# Patient Record
Sex: Male | Born: 1980
Health system: Southern US, Community
[De-identification: ages and names within clinical notes are randomized; demographics above are authoritative.]

## PROBLEM LIST (undated history)

## (undated) DIAGNOSIS — K219 Gastro-esophageal reflux disease without esophagitis: Secondary | ICD-10-CM

## (undated) DIAGNOSIS — H409 Unspecified glaucoma: Secondary | ICD-10-CM

## (undated) DIAGNOSIS — K760 Fatty (change of) liver, not elsewhere classified: Secondary | ICD-10-CM

## (undated) DIAGNOSIS — E7800 Pure hypercholesterolemia, unspecified: Secondary | ICD-10-CM

## (undated) DIAGNOSIS — Z87442 Personal history of urinary calculi: Secondary | ICD-10-CM

## (undated) HISTORY — DX: Gastro-esophageal reflux disease without esophagitis: K21.9

## (undated) HISTORY — DX: Personal history of urinary calculi: Z87.442

## (undated) HISTORY — PX: DENTAL RESTORATION/EXTRACTION WITH X-RAY: SHX5796

## (undated) HISTORY — PX: CARDIOVASCULAR STRESS TEST: SHX262

## (undated) HISTORY — DX: Pure hypercholesterolemia, unspecified: E78.00

## (undated) HISTORY — DX: Unspecified glaucoma: H40.9

## (undated) HISTORY — DX: Fatty (change of) liver, not elsewhere classified: K76.0

---

## 2011-03-03 ENCOUNTER — Ambulatory Visit: Payer: BC Managed Care – HMO | Attending: Orthopedic Surgery | Admitting: Physical Therapy

## 2011-03-03 DIAGNOSIS — M545 Low back pain, unspecified: Secondary | ICD-10-CM | POA: Insufficient documentation

## 2011-03-03 DIAGNOSIS — IMO0001 Reserved for inherently not codable concepts without codable children: Secondary | ICD-10-CM | POA: Insufficient documentation

## 2011-03-03 DIAGNOSIS — R293 Abnormal posture: Secondary | ICD-10-CM | POA: Insufficient documentation

## 2011-03-03 DIAGNOSIS — R5381 Other malaise: Secondary | ICD-10-CM | POA: Insufficient documentation

## 2011-03-05 ENCOUNTER — Ambulatory Visit: Payer: BC Managed Care – HMO | Admitting: Physical Therapy

## 2011-03-10 ENCOUNTER — Ambulatory Visit: Payer: BC Managed Care – HMO | Admitting: Physical Therapy

## 2011-03-12 ENCOUNTER — Encounter: Payer: BC Managed Care – HMO | Admitting: Physical Therapy

## 2011-03-13 ENCOUNTER — Ambulatory Visit: Payer: BC Managed Care – HMO | Admitting: Physical Therapy

## 2011-03-17 ENCOUNTER — Ambulatory Visit: Payer: BC Managed Care – HMO | Admitting: *Deleted

## 2011-03-18 ENCOUNTER — Ambulatory Visit: Payer: BC Managed Care – HMO | Admitting: Physical Therapy

## 2011-03-19 ENCOUNTER — Ambulatory Visit: Payer: BC Managed Care – HMO | Admitting: Physical Therapy

## 2011-03-20 ENCOUNTER — Ambulatory Visit: Payer: BC Managed Care – HMO | Admitting: Physical Therapy

## 2011-03-24 ENCOUNTER — Ambulatory Visit: Payer: BC Managed Care – HMO | Attending: Orthopedic Surgery | Admitting: Physical Therapy

## 2011-03-24 DIAGNOSIS — M545 Low back pain, unspecified: Secondary | ICD-10-CM | POA: Insufficient documentation

## 2011-03-24 DIAGNOSIS — R293 Abnormal posture: Secondary | ICD-10-CM | POA: Insufficient documentation

## 2011-03-24 DIAGNOSIS — R5381 Other malaise: Secondary | ICD-10-CM | POA: Insufficient documentation

## 2011-03-24 DIAGNOSIS — IMO0001 Reserved for inherently not codable concepts without codable children: Secondary | ICD-10-CM | POA: Insufficient documentation

## 2011-03-26 ENCOUNTER — Ambulatory Visit: Payer: BC Managed Care – HMO | Admitting: Physical Therapy

## 2011-03-31 ENCOUNTER — Encounter: Payer: BC Managed Care – HMO | Admitting: Physical Therapy

## 2011-04-01 ENCOUNTER — Ambulatory Visit: Payer: BC Managed Care – HMO | Admitting: Physical Therapy

## 2011-04-02 ENCOUNTER — Ambulatory Visit: Payer: BC Managed Care – HMO | Admitting: Physical Therapy

## 2011-04-09 ENCOUNTER — Ambulatory Visit: Payer: BC Managed Care – HMO | Admitting: Physical Therapy

## 2016-08-18 DIAGNOSIS — H401131 Primary open-angle glaucoma, bilateral, mild stage: Secondary | ICD-10-CM | POA: Diagnosis not present

## 2016-11-23 HISTORY — PX: ESOPHAGOGASTRODUODENOSCOPY: SHX1529

## 2016-12-31 DIAGNOSIS — S0502XA Injury of conjunctiva and corneal abrasion without foreign body, left eye, initial encounter: Secondary | ICD-10-CM | POA: Diagnosis not present

## 2016-12-31 DIAGNOSIS — H40053 Ocular hypertension, bilateral: Secondary | ICD-10-CM | POA: Diagnosis not present

## 2017-01-15 DIAGNOSIS — H401131 Primary open-angle glaucoma, bilateral, mild stage: Secondary | ICD-10-CM | POA: Diagnosis not present

## 2017-02-16 ENCOUNTER — Ambulatory Visit (INDEPENDENT_AMBULATORY_CARE_PROVIDER_SITE_OTHER): Payer: BLUE CROSS/BLUE SHIELD | Admitting: Family Medicine

## 2017-02-16 ENCOUNTER — Encounter (INDEPENDENT_AMBULATORY_CARE_PROVIDER_SITE_OTHER): Payer: Self-pay

## 2017-02-16 ENCOUNTER — Encounter: Payer: Self-pay | Admitting: Family Medicine

## 2017-02-16 VITALS — BP 136/86 | HR 74 | Temp 97.1°F | Ht 71.0 in | Wt 214.4 lb

## 2017-02-16 DIAGNOSIS — K5900 Constipation, unspecified: Secondary | ICD-10-CM

## 2017-02-16 DIAGNOSIS — R079 Chest pain, unspecified: Secondary | ICD-10-CM

## 2017-02-16 NOTE — Progress Notes (Signed)
   HPI  Patient presents today here to establish care and discuss chest pain and constipation.  Chest pain Patient describes left-sided "cold feeling" on his left chest that radiates to his left arm. He states that he is concerned that he is going to "have a heart attack. His symptoms are not exertional. They come on at rest or with activity. They often last about 30 minutes. They're not associated with shortness of breath, sweating, or racing heart.  Constipation Has been going on for several years, patient states that he had improvement with Metamucil states that this is not easy to maintain for him. He has tried intermittent probiotics.  Patient exercises regularly, however has not exercised for about 2 months. He watches his diet carefully avoiding fried and fatty foods. He has stopped drinking sodas and sugar sweetened beverages.  His father has not had any heart attacks, his father's cousin had a heart attack at a young age in his 1s.  Patient has been told that his cholesterol was very high at a cholesterol screening  PMH: Diverticulitis, duodenitis Family history: COPD father, diabetes maternal grandmother, pancreatic cancer mother, TIA and father Social history: Former smoker, does not drink alcohol, works as a Development worker, community Past surgical history- none ROS: Per HPI  Objective: BP 136/86   Pulse 74   Temp 97.1 F (36.2 C) (Oral)   Ht _0  (1.803 m)   Wt 214 lb 6.4 oz (97.3 kg)   BMI 29.90 kg/m  Gen: NAD, alert, cooperative with exam HEENT: NCAT, right TM obscured by cerumen, left TM within normal limits, oropharynx clear, nares clear, PERRLA, EOMI CV: RRR, good S1/S2, no murmur Resp: CTABL, no wheezes, non-labored Abd: SNTND, BS present, no guarding or organomegaly Ext: No edema, warm Neuro: Alert and oriented, 1+ symmetric patellar tendon reflexes, strength 5/5 and sensation intact in bilateral lower extremities  Assessment and plan:  # Chest  pain Atypical chest pain, no exertional symptoms EKG WNL Reassurance provided, most likely anxiety related, would also consider neurologic origin with unsual cold sensation.  liberate exercise  # Constipation Asymptomatic Recommend a daily probiotic or yogurt +25 g of fiber daily, increase fluid intake     Orders Placed This Encounter  Procedures  . CMP14+EGFR    Standing Status:   Future    Standing Expiration Date:   02/16/2018  . CBC with Differential/Platelet    Standing Status:   Future    Standing Expiration Date:   02/16/2018  . Lipid panel    Standing Status:   Future    Standing Expiration Date:   02/16/2018  . TSH    Standing Status:   Future    Standing Expiration Date:   02/16/2018  . EKG 12-Lead    Meds ordered this encounter  Medications  . latanoprost (XALATAN) 0.005 % ophthalmic solution    Sig: 1 drop at bedtime.    Laroy Apple, MD [facility] 02/16/2017, 1:49 PM

## 2017-02-16 NOTE — Patient Instructions (Addendum)
Great to meet you!  Please come back for labs when you are fasting.    Lets plan on seeing you again in about 3 months to discuss the cold feeling in your chest.

## 2017-02-27 ENCOUNTER — Other Ambulatory Visit: Payer: BLUE CROSS/BLUE SHIELD

## 2017-02-27 ENCOUNTER — Telehealth: Payer: Self-pay | Admitting: Family Medicine

## 2017-02-27 DIAGNOSIS — R079 Chest pain, unspecified: Secondary | ICD-10-CM | POA: Diagnosis not present

## 2017-02-28 LAB — LIPID PANEL
Chol/HDL Ratio: 4.8 ratio (ref 0.0–5.0)
Cholesterol, Total: 207 mg/dL — ABNORMAL HIGH (ref 100–199)
HDL: 43 mg/dL (ref >39)
LDL Calculated: 151 mg/dL — ABNORMAL HIGH (ref 0–99)
Triglycerides: 65 mg/dL (ref 0–149)
VLDL Cholesterol Cal: 13 mg/dL (ref 5–40)

## 2017-02-28 LAB — CBC WITH DIFFERENTIAL/PLATELET
Basophils Absolute: 0 10*3/uL (ref 0.0–0.2)
Basos: 0 %
EOS (ABSOLUTE): 0.2 10*3/uL (ref 0.0–0.4)
Eos: 3 %
Hematocrit: 43.2 % (ref 37.5–51.0)
Hemoglobin: 14.6 g/dL (ref 13.0–17.7)
Immature Grans (Abs): 0 10*3/uL (ref 0.0–0.1)
Immature Granulocytes: 0 %
Lymphocytes Absolute: 2.8 10*3/uL (ref 0.7–3.1)
Lymphs: 40 %
MCH: 29.1 pg (ref 26.6–33.0)
MCHC: 33.8 g/dL (ref 31.5–35.7)
MCV: 86 fL (ref 79–97)
Monocytes Absolute: 0.5 10*3/uL (ref 0.1–0.9)
Monocytes: 7 %
Neutrophils Absolute: 3.4 10*3/uL (ref 1.4–7.0)
Neutrophils: 50 %
Platelets: 253 10*3/uL (ref 150–379)
RBC: 5.01 x10E6/uL (ref 4.14–5.80)
RDW: 13.6 % (ref 12.3–15.4)
WBC: 6.9 10*3/uL (ref 3.4–10.8)

## 2017-02-28 LAB — CMP14+EGFR
ALT: 55 IU/L — ABNORMAL HIGH (ref 0–44)
AST: 28 IU/L (ref 0–40)
Albumin/Globulin Ratio: 1.9 (ref 1.2–2.2)
Albumin: 4.6 g/dL (ref 3.5–5.5)
Alkaline Phosphatase: 47 IU/L (ref 39–117)
BUN/Creatinine Ratio: 18 (ref 9–20)
BUN: 17 mg/dL (ref 6–20)
Bilirubin Total: 0.4 mg/dL (ref 0.0–1.2)
CO2: 23 mmol/L (ref 18–29)
Calcium: 9.4 mg/dL (ref 8.7–10.2)
Chloride: 103 mmol/L (ref 96–106)
Creatinine, Ser: 0.95 mg/dL (ref 0.76–1.27)
GFR calc Af Amer: 120 mL/min/{1.73_m2} (ref >59)
GFR calc non Af Amer: 104 mL/min/{1.73_m2} (ref >59)
Globulin, Total: 2.4 g/dL (ref 1.5–4.5)
Glucose: 90 mg/dL (ref 65–99)
Potassium: 4.5 mmol/L (ref 3.5–5.2)
Sodium: 141 mmol/L (ref 134–144)
Total Protein: 7 g/dL (ref 6.0–8.5)

## 2017-02-28 LAB — TSH: TSH: 2.06 u[IU]/mL (ref 0.450–4.500)

## 2017-03-01 ENCOUNTER — Other Ambulatory Visit: Payer: Self-pay | Admitting: *Deleted

## 2017-03-01 DIAGNOSIS — R748 Abnormal levels of other serum enzymes: Secondary | ICD-10-CM

## 2017-03-04 NOTE — Telephone Encounter (Signed)
Patient states he already spoke to doctor when he came in for his blood work. Patient states he was told to follow up with [place] in 3 months and monitor the rectal bleeding after wiping.

## 2017-03-28 DIAGNOSIS — R1084 Generalized abdominal pain: Secondary | ICD-10-CM | POA: Diagnosis not present

## 2017-03-28 DIAGNOSIS — K5909 Other constipation: Secondary | ICD-10-CM | POA: Diagnosis not present

## 2017-03-30 DIAGNOSIS — K5909 Other constipation: Secondary | ICD-10-CM | POA: Diagnosis not present

## 2017-03-30 DIAGNOSIS — K210 Gastro-esophageal reflux disease with esophagitis: Secondary | ICD-10-CM | POA: Diagnosis not present

## 2017-03-30 DIAGNOSIS — R945 Abnormal results of liver function studies: Secondary | ICD-10-CM | POA: Diagnosis not present

## 2017-04-27 ENCOUNTER — Encounter: Payer: Self-pay | Admitting: Nurse Practitioner

## 2017-04-27 ENCOUNTER — Ambulatory Visit: Payer: BLUE CROSS/BLUE SHIELD | Admitting: Family Medicine

## 2017-04-27 ENCOUNTER — Ambulatory Visit (INDEPENDENT_AMBULATORY_CARE_PROVIDER_SITE_OTHER): Payer: BLUE CROSS/BLUE SHIELD | Admitting: Nurse Practitioner

## 2017-04-27 ENCOUNTER — Ambulatory Visit (INDEPENDENT_AMBULATORY_CARE_PROVIDER_SITE_OTHER): Payer: BLUE CROSS/BLUE SHIELD

## 2017-04-27 VITALS — BP 133/80 | HR 72 | Temp 97.8°F | Ht 71.0 in | Wt 211.0 lb

## 2017-04-27 DIAGNOSIS — R079 Chest pain, unspecified: Secondary | ICD-10-CM

## 2017-04-27 DIAGNOSIS — K5900 Constipation, unspecified: Secondary | ICD-10-CM | POA: Diagnosis not present

## 2017-04-27 DIAGNOSIS — R1084 Generalized abdominal pain: Secondary | ICD-10-CM | POA: Diagnosis not present

## 2017-04-27 NOTE — Progress Notes (Signed)
   Subjective:    Patient ID: Blake Jordan, male    DOB: 08/23/1981, 36 y.o.   MRN: [id number]  HPI Patient comes in today with several complaints: - patient c/o abdominal pain- Has had for a long tome- has trouble with constipation- miralax usually helps- but he does not take everyday. Started back on 2 days ago and has had 4bm today. - chest pain- has been going on for last 6 months- describes pain as numb feeling or achiness. Not heavy feeling or sharp pain. Usually last for 1-2 hours at a time- denies SOB. Cannot relate pain to eating or not eating.just recently started on omeprazole which he is not sure if helps oor not.    Review of Systems  HENT: Negative.   Respiratory: Negative for shortness of breath.   Cardiovascular: Positive for chest pain. Negative for palpitations and leg swelling.  Gastrointestinal: Positive for abdominal pain and constipation. Negative for abdominal distention, diarrhea, nausea and vomiting.  Genitourinary: Negative.   Neurological: Negative.   Psychiatric/Behavioral: Negative.   All other systems reviewed and are negative.      Objective:   Physical Exam  Constitutional: He is oriented to person, place, and time. He appears well-developed and well-nourished. No distress.  Cardiovascular: Normal rate and regular rhythm.   Pulmonary/Chest: Effort normal and breath sounds normal.  Abdominal: Soft. Bowel sounds are normal. There is tenderness (mild diffuse tenderness).  Neurological: He is alert and oriented to person, place, and time.  Skin: Skin is warm.  Psychiatric: He has a normal mood and affect. His behavior is normal. Judgment and thought content normal.   BP 133/80   Pulse 72   Temp 97.8 F (36.6 C) (Oral)   Ht 5\' 11"  (1.803 m)   Wt 211 lb (95.7 kg)   BMI 29.43 kg/m        Assessment & Plan:  1. Generalized abdominal pain - DG Abd 1 View; Future  2. Constipation, unspecified constipation type MIRALAX IN APPLE JUICE  DAILY  3. Chest pain, unspecified type Not coming from heart Could be acid reflux- continue omeprazole daily Decrease spicy and fatty foods Follow up with Dr. Burt Knack, FNP  - EKG 12-Lead

## 2017-04-27 NOTE — Patient Instructions (Signed)
Gastroesophageal Reflux Scan A gastroesophageal reflux scan is a procedure that is used to check for gastroesophageal reflux, which is the backward flow of stomach contents into the tube that carries food from the mouth to the stomach (esophagus). The scan can also show if any stomach contents are inhaled (aspirated) into your lungs. You may need this scan if you have symptoms such as heartburn, vomiting, swallowing problems, or regurgitation. Regurgitation means that swallowed food is returning from the stomach to the esophagus. For this scan, you will drink a liquid that contains a small amount of a radioactive substance (tracer). A scanner with a camera that detects the radioactive tracer is used to see if any of the material backs up into your esophagus. Tell a health care provider about:  Any allergies you have.  All medicines you are taking, including vitamins, herbs, eye drops, creams, and over-the-counter medicines.  Any blood disorders you have.  Any surgeries you have had.  Any medical conditions you have.  If you are pregnant or you think that you may be pregnant.  If you are breastfeeding. What are the risks? Generally, this is a safe procedure. However, problems may occur, including:  Exposure to radiation (a small amount).  Allergic reaction to the radioactive substance. This is rare.  What happens before the procedure?  Ask your health care provider about changing or stopping your regular medicines. This is especially important if you are taking diabetes medicines or blood thinners.  Follow your health care provider's instructions about eating or drinking restrictions. What happens during the procedure?  You will be asked to drink a liquid that contains a small amount of a radioactive tracer. This liquid will probably be similar to orange juice.  You will assume a position lying on your back.  A series of images will be taken of your esophagus and upper  stomach.  You may be asked to move into different positions to help determine if reflux occurs more often when you are in specific positions.  For adults, an abdominal binder with an inflatable cuff may be placed on the belly (abdomen). This may be used to increase abdominal pressure. More images will be taken to see if the increased pressure causes reflux to occur. The procedure may vary among health care providers and hospitals. What happens after the procedure?  Return to your normal activities and your normal diet as directed by your health care provider.  The radioactive tracer will leave your body over the next few days. Drink enough fluid to keep your urine clear or pale yellow. This will help to flush the tracer out of your body.  It is your responsibility to obtain your test results. Ask your health care provider or the department performing the test when and how you will get your results. This information is not intended to replace advice given to you by your health care provider. Make sure you discuss any questions you have with your health care provider. Document Released: 12/31/2005 Document Revised: 08/03/2016 Document Reviewed: 08/21/2014 Elsevier Interactive Patient Education  2018 Elsevier Inc.  

## 2017-04-28 ENCOUNTER — Ambulatory Visit (INDEPENDENT_AMBULATORY_CARE_PROVIDER_SITE_OTHER): Payer: BLUE CROSS/BLUE SHIELD | Admitting: Family Medicine

## 2017-04-28 ENCOUNTER — Encounter: Payer: Self-pay | Admitting: Family Medicine

## 2017-04-28 VITALS — BP 131/76 | HR 73 | Temp 97.5°F | Ht 71.0 in | Wt 209.2 lb

## 2017-04-28 DIAGNOSIS — R1084 Generalized abdominal pain: Secondary | ICD-10-CM | POA: Diagnosis not present

## 2017-04-28 DIAGNOSIS — R0789 Other chest pain: Secondary | ICD-10-CM

## 2017-04-28 DIAGNOSIS — M792 Neuralgia and neuritis, unspecified: Secondary | ICD-10-CM

## 2017-04-28 NOTE — Progress Notes (Signed)
   HPI  Patient presents today here with abdominal pain and left-sided chest pain.  Patient states these had off-and-on abdominal pain he has felt to be associated with constipation. However he states that previously he was diagnosed with gastritis, duodenitis, and diverticulitis with CT scanning. He states also that his brother was diagnosed with stomach cancer and he is 2 years younger than him. Patient is very concerned, he has had improvement since starting mural ask a few days ago.  Chest pain Unusual chest pain described as achiness and cold sensation starting from the left sternum and extending to the left axilla then down the arm. Patient states it last 1-2 hours and goes away. There are no aggravating or alleviating factors, no sharp pains. No exertional pains. No associated shortness of breath. Not clearly associated with eating.  Patient is a taking a PPI for about one week.  PMH: Smoking status noted ROS: Per HPI  Objective: BP 131/76   Pulse 73   Temp 97.5 F (36.4 C) (Oral)   Ht 5\' 11"  (1.803 m)   Wt 209 lb 3.2 oz (94.9 kg)   BMI 29.18 kg/m  Gen: NAD, alert, cooperative with exam HEENT: NCAT CV: RRR, good S1/S2, no murmur Chest wall: No tenderness to palpation of the sternal border Resp: CTABL, no wheezes, non-labored Abd: Diffuse mild tenderness throughout, positive bowel sounds, no guarding, no rebound Ext: No edema, warm Neuro: Alert and oriented, No gross deficits  Assessment and plan:  # Atypical chest pain Very unusual chest pain, EKG normal 2, no exertional symptoms Unlikely to be cardiac in etiology Considering urologic origin with cool sensation, however he does have some associated GI symptoms. Consider esophageal dysmotility or spasms, gastritis Also consider musculoskeletal neck pain with some associated left-sided neck pain. We did discuss anxiety, he does appear slightly anxious on exam, however he does not feel this is the case  # Generalized  abdominal pain Intermittent, usually associated with constipation Considering his history of gastritis, duodenitis, diverticulosis, and brother with stomach cancer think it's most prudent to refer to GI. Appreciate their recommendations and evaluation. Continue PPI [place] abd ordered      Orders Placed This Encounter  Procedures  . US Abdomen Complete    Standing Status:   Future    Standing Expiration Date:   06/28/2018    Order Specific Question:   Reason for Exam (SYMPTOM  OR DIAGNOSIS REQUIRED)    Answer:   abd pain    Order Specific Question:   Preferred imaging location?    Answer:   [facility]  . Ambulatory referral to Gastroenterology    Referral Priority:   Routine    Referral Type:   Consultation    Referral Reason:   Specialty Services Required    Number of Visits Requested:   1    Meds ordered this encounter  Medications  . omeprazole (PRILOSEC) 40 MG capsule    Refill:  0    Laroy Apple, MD [facility] 04/28/2017, 5:08 PM

## 2017-04-28 NOTE — Patient Instructions (Addendum)
Great to see you!  Come back in 2 months unless you need [place] sooner.   We are working on a referral to GI.   Continue taking the omeprazole.

## 2017-04-30 ENCOUNTER — Encounter: Payer: Self-pay | Admitting: Internal Medicine

## 2017-05-05 ENCOUNTER — Ambulatory Visit (HOSPITAL_COMMUNITY)
Admission: RE | Admit: 2017-05-05 | Discharge: 2017-05-05 | Disposition: A | Discharge status: Home / Self Care | Payer: BLUE CROSS/BLUE SHIELD | Source: Ambulatory Visit | Attending: Family Medicine | Admitting: Family Medicine

## 2017-05-05 ENCOUNTER — Telehealth: Payer: Self-pay | Admitting: Family Medicine

## 2017-05-05 DIAGNOSIS — K760 Fatty (change of) liver, not elsewhere classified: Secondary | ICD-10-CM | POA: Insufficient documentation

## 2017-05-05 DIAGNOSIS — R1084 Generalized abdominal pain: Secondary | ICD-10-CM

## 2017-05-06 NOTE — Telephone Encounter (Signed)
Pt notified of results Verbalizes understanding 

## 2017-05-06 NOTE — Telephone Encounter (Signed)
Abd US shows only fatty liver, no cause for abd pain seen. Would recommend follow up with GI as planned.   Laroy Apple, MD [facility] 05/06/2017, 7:40 AM

## 2017-06-04 ENCOUNTER — Ambulatory Visit (INDEPENDENT_AMBULATORY_CARE_PROVIDER_SITE_OTHER): Payer: BLUE CROSS/BLUE SHIELD | Admitting: Internal Medicine

## 2017-06-04 ENCOUNTER — Encounter: Payer: Self-pay | Admitting: Internal Medicine

## 2017-06-04 VITALS — BP 110/66 | HR 64 | Ht 71.0 in | Wt 200.2 lb

## 2017-06-04 DIAGNOSIS — G8929 Other chronic pain: Secondary | ICD-10-CM | POA: Diagnosis not present

## 2017-06-04 DIAGNOSIS — K644 Residual hemorrhoidal skin tags: Secondary | ICD-10-CM | POA: Diagnosis not present

## 2017-06-04 DIAGNOSIS — M25512 Pain in left shoulder: Secondary | ICD-10-CM | POA: Diagnosis not present

## 2017-06-04 DIAGNOSIS — K581 Irritable bowel syndrome with constipation: Secondary | ICD-10-CM

## 2017-06-04 DIAGNOSIS — K625 Hemorrhage of anus and rectum: Secondary | ICD-10-CM

## 2017-06-04 MED ORDER — HYOSCYAMINE SULFATE 0.125 MG SL SUBL: 0.1250 mg | SUBLINGUAL_TABLET | SUBLINGUAL | 1 refills | Status: DC | PRN

## 2017-06-04 NOTE — Patient Instructions (Addendum)
We are giving you a high fiber diet handout to read and follow.   This weekend do a bowel purge as follows:  Dr Carlean Purl recommends that you complete a bowel purge (to clean out your bowels). Please do the following: Purchase a bottle of Miralax over the counter as well as a box of 5 mg dulcolax tablets. Take 4 dulcolax tablets. Wait 1 hour. You will then drink 6-8 capfuls of Miralax mixed in an adequate amount of water/juice/gatorade (you may choose which of these liquids to drink) over the next 2-3 hours. You should expect results within 1 to 6 hours after completing the bowel purge.   Use 1-2 tablespoons of benefiber nightly, handout provided.   You can use Miralax several days a week as needed.  We have sent the following medications to your pharmacy for you to pick up at your convenience: Generic Levsin SL   MyChart [place] with an update.    I appreciate the opportunity to care for you. Silvano Rusk, MD, [facility]

## 2017-06-04 NOTE — Progress Notes (Signed)
Blake Jordan 36 y.o. 1981-04-23 [id number] Referred by: Timmothy Euler, MD  Assessment & Plan:   Encounter Diagnoses  Name Primary?  . Irritable bowel syndrome with constipation Yes  . Rectal bleeding   . External hemorrhoids   . Chronic left shoulder pain    1. Colon clean out- 4 dulcolax pills followed by 6-8 doses of Miralax.  Instructions were given as it is likely that his colon is full and may be backed up.  Hopefully a colon cleanse will help to establish a baseline of how constipated he is as he has dealt with constipation for a long time now. With a good starting point we hope to manage his IBS-C and pain with daily benefiber lifestyle improvements with high fiber diet and prn Miralax.  2. Rectal bleeding was most likely from hemorrhoids during a past episode of having to strain during a BM.  He has not had bleeding recently. Hemorrhoids appeared slightly irritated, but not thrombosed or actively bleeding.  There is some evidence of history of small anal fissure with residual sentinel pile- well healed.  3. Reassurance given that there is no need for additional testing at the time for his vague shoulder and chest pain.  No EGD is necessary as pain is most likely musculoskeletal in nature. Encouraged seeing an orthopedist, sports medicine  or his primary care provider for further evaluation of shoulder and chest pains.  4. Follow up with GI PRN.  Any questions or concerns may reach out through My Chart.  I have personally seen the patient, reviewed and repeated key elements of the history and physical and participated in formation of the assessment and plan the student has documented.  Seems like IBS, some health-related anxiety and musculoskeletal issues.  He may not need PPI - not for the sholuder sxs for sure. I appreciate the opportunity to care for this patient. Gatha Mayer, MD, [facility]  CC: Timmothy Euler, MD  Subjective:   Chief  Complaint: Abdominal Pain with constipation Intermittent L shoulder/ chest pain  HPI  Blake Jordan is a 36 yo male who presents for consult evaluation of chronic abdominal pain with constipation and non distinct L shoulder pains.  He describes the abdominal pain as constant dull nagging pain, that "moves around my belly".   It does not interfere with his activities of daily living but has him concerned as to why he is having vague pains throughout his abdomen.  He has a long history of constipation- only 2 BMs/ week and straining with bowel movements and 1 episode of blood with a bowel movement.  He has tried Miralax which seems to help some and keeps his BMs softer, increases BM frequency and reduces straining. But he continues to have pain in his abdomen.  He denies, N/V/D, no fevers, chills, weight loss or weight gain.   His chest pain is intermittent in nature, radiates at times into his left shoulder and left scapula area.  He has had cardiac evaluation and determined chest pain is not ACS, arrhythmias or cardiovascular in nature.  He denies reflux symptoms, heart burn, dysphagia, dyspepsia.  He takes omeprazole 40mg  which controls any reflux. He has no nausea or vomiting. He cannot associate the shoulder pain with any specific foods or stressors- it is possible it started bothering him when he went the gym, but he is unsure.   No Known Allergies Current Meds  Medication Sig  . latanoprost (XALATAN)  0.005 % ophthalmic solution 1 drop at bedtime.  Marland Kitchen omeprazole (PRILOSEC) 40 MG capsule daily.   . polyethylene glycol (MIRALAX / GLYCOLAX) packet Take 17 g by mouth daily. hasnt used in a week   Past Medical History:  Diagnosis Date  . GERD (gastroesophageal reflux disease)   . Glaucoma    potentially will have - needs eye drops  . History of kidney stones   . Hypercholesterolemia    Past Surgical History:  Procedure Laterality Date  . None     Social History   Social History  . Marital  status: Married    Spouse name: N/A  . Number of children: 4  . Years of education: N/A   Occupational History  . 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 Smoking status: Former Research scientist (life sciences)  . Smokeless tobacco: Former Systems developer  . Alcohol use No  . Drug use: No   Social History Narrative   Married, 4 children   Heavy Company secretary   family history includes COPD in his father; Cirrhosis in his maternal aunt; Diabetes in his maternal grandmother; Heart disease in his brother and father; Hypertension in his father; Pancreatic cancer in his mother.   Review of Systems Pertinent review in HPI- all other systems negative.   Objective:   Physical Exam @BP  110/66   Pulse 64   Ht 5\' 11"  (1.803 m)   Wt 200 lb 4 oz (90.8 kg)   BMI 27.93 kg/m @  General:  Well-developed, well-nourished and in no acute distress Eyes:  anicteric. ENT:   Mouth and posterior pharynx free of lesions.  Neck:   supple w/o thyromegaly or mass.  Lungs: Clear to auscultation bilaterally. Heart:   S1S2, no rubs, murmurs, gallops. Abdomen:  soft, mildly tender to palpation RUQ and LUQ, no hepatosplenomegaly,   hernia, or mass and BS+.  Rectal: By Dr. Carlean Purl - NL tone, resting and voluntary, appropriate descent + anal wink, NL anoderm  Anoscopy: Gr 1 inflamed external hemorrhoids all positions - posterior sentinel pile suggesting prior fissure Gatha Mayer, MD, [facility]   Lymph:  no cervical or supraclavicular adenopathy. Extremities:   no edema, cyanosis or clubbing Skin   no rash. Neuro:  A&O x 3.  Psych:  appropriate mood and  Affect.   Data Reviewed: <ECG> 04/27/2017 PCP notes regarding chest pain.   Edward Qualia, [place]

## 2017-06-07 ENCOUNTER — Telehealth: Payer: Self-pay | Admitting: Internal Medicine

## 2017-06-07 NOTE — Telephone Encounter (Signed)
Patient is concerned about hyoscyamine and his glaucoma.  Please review and advise if there is an alternative

## 2017-06-07 NOTE — Telephone Encounter (Signed)
He is correct - he should not take it - that was my mistake  If he purchased it let me know the cost and I will send him what it cost   I think he can try IB Gard prn as written on the label - for abdominal pain   My apologies and thanks for him bringing it to my attention

## 2017-06-08 NOTE — Telephone Encounter (Signed)
Patient notified He will call back if his symptoms fail to improve after a week on the IBGard

## 2017-06-10 ENCOUNTER — Encounter: Payer: Self-pay | Admitting: Family Medicine

## 2017-06-21 ENCOUNTER — Encounter: Payer: Self-pay | Admitting: Family Medicine

## 2017-06-21 DIAGNOSIS — R002 Palpitations: Secondary | ICD-10-CM

## 2017-06-21 DIAGNOSIS — R079 Chest pain, unspecified: Secondary | ICD-10-CM

## 2017-06-22 ENCOUNTER — Emergency Department (HOSPITAL_COMMUNITY): Payer: BLUE CROSS/BLUE SHIELD

## 2017-06-22 ENCOUNTER — Emergency Department (HOSPITAL_COMMUNITY)
Admission: EM | Admit: 2017-06-22 | Discharge: 2017-06-22 | Disposition: A | Discharge status: Home / Self Care | Payer: BLUE CROSS/BLUE SHIELD | Attending: Emergency Medicine | Admitting: Emergency Medicine

## 2017-06-22 ENCOUNTER — Encounter (HOSPITAL_COMMUNITY): Payer: Self-pay

## 2017-06-22 DIAGNOSIS — R109 Unspecified abdominal pain: Secondary | ICD-10-CM | POA: Diagnosis not present

## 2017-06-22 DIAGNOSIS — Z87891 Personal history of nicotine dependence: Secondary | ICD-10-CM | POA: Insufficient documentation

## 2017-06-22 DIAGNOSIS — R1083 Colic: Secondary | ICD-10-CM | POA: Diagnosis not present

## 2017-06-22 DIAGNOSIS — N23 Unspecified renal colic: Secondary | ICD-10-CM | POA: Diagnosis not present

## 2017-06-22 DIAGNOSIS — N200 Calculus of kidney: Secondary | ICD-10-CM

## 2017-06-22 DIAGNOSIS — Z79899 Other long term (current) drug therapy: Secondary | ICD-10-CM | POA: Insufficient documentation

## 2017-06-22 DIAGNOSIS — N1330 Unspecified hydronephrosis: Secondary | ICD-10-CM | POA: Diagnosis not present

## 2017-06-22 LAB — COMPREHENSIVE METABOLIC PANEL
ALT: 36 U/L (ref 17–63)
AST: 22 U/L (ref 15–41)
Albumin: 4.9 g/dL (ref 3.5–5.0)
Alkaline Phosphatase: 46 U/L (ref 38–126)
Anion gap: 9 (ref 5–15)
BUN: 20 mg/dL (ref 6–20)
CO2: 29 mmol/L (ref 22–32)
Calcium: 9.5 mg/dL (ref 8.9–10.3)
Chloride: 104 mmol/L (ref 101–111)
Creatinine, Ser: 1.13 mg/dL (ref 0.61–1.24)
GFR calc Af Amer: >60 mL/min (ref >60)
GFR calc non Af Amer: >60 mL/min (ref >60)
Glucose, Bld: 105 mg/dL — ABNORMAL HIGH (ref 65–99)
Potassium: 3.7 mmol/L (ref 3.5–5.1)
Sodium: 142 mmol/L (ref 135–145)
Total Bilirubin: 0.8 mg/dL (ref 0.3–1.2)
Total Protein: 8.2 g/dL — ABNORMAL HIGH (ref 6.5–8.1)

## 2017-06-22 LAB — URINALYSIS, ROUTINE W REFLEX MICROSCOPIC
Bacteria, UA: NONE SEEN
Bilirubin Urine: NEGATIVE
Glucose, UA: NEGATIVE mg/dL
Ketones, ur: NEGATIVE mg/dL
Leukocytes, UA: NEGATIVE
Nitrite: NEGATIVE
Protein, ur: NEGATIVE mg/dL
Specific Gravity, Urine: 1.027 (ref 1.005–1.030)
Squamous Epithelial / LPF: NONE SEEN
pH: 5 (ref 5.0–8.0)

## 2017-06-22 LAB — CBC WITH DIFFERENTIAL/PLATELET
Basophils Absolute: 0 10*3/uL (ref 0.0–0.1)
Basophils Relative: 0 %
Eosinophils Absolute: 0.2 10*3/uL (ref 0.0–0.7)
Eosinophils Relative: 2 %
HCT: 44.6 % (ref 39.0–52.0)
Hemoglobin: 14.9 g/dL (ref 13.0–17.0)
Lymphocytes Relative: 40 %
Lymphs Abs: 2.6 10*3/uL (ref 0.7–4.0)
MCH: 29.4 pg (ref 26.0–34.0)
MCHC: 33.4 g/dL (ref 30.0–36.0)
MCV: 88.1 fL (ref 78.0–100.0)
Monocytes Absolute: 0.4 10*3/uL (ref 0.1–1.0)
Monocytes Relative: 7 %
Neutro Abs: 3.3 10*3/uL (ref 1.7–7.7)
Neutrophils Relative %: 51 %
Platelets: 237 10*3/uL (ref 150–400)
RBC: 5.06 MIL/uL (ref 4.22–5.81)
RDW: 12.9 % (ref 11.5–15.5)
WBC: 6.5 10*3/uL (ref 4.0–10.5)

## 2017-06-22 MED ORDER — ONDANSETRON HCL 4 MG/2ML IJ SOLN
4.0000 mg | Freq: Once | INTRAMUSCULAR | Status: AC
  Administered 2017-06-22: 4 mg via INTRAVENOUS
  Filled 2017-06-22: qty 2

## 2017-06-22 MED ORDER — TAMSULOSIN HCL 0.4 MG PO CAPS: 0.4000 mg | ORAL_CAPSULE | Freq: Every day | ORAL | 0 refills | Status: DC

## 2017-06-22 MED ORDER — HYDROCODONE-ACETAMINOPHEN 5-325 MG PO TABS: 1.0000 | ORAL_TABLET | Freq: Three times a day (TID) | ORAL | 0 refills | Status: DC | PRN

## 2017-06-22 MED ORDER — KETOROLAC TROMETHAMINE 30 MG/ML IJ SOLN
15.0000 mg | Freq: Once | INTRAMUSCULAR | Status: AC
  Administered 2017-06-22: 15 mg via INTRAVENOUS
  Filled 2017-06-22: qty 1

## 2017-06-22 MED ORDER — SODIUM CHLORIDE 0.9 % IV BOLUS (SEPSIS)
1000.0000 mL | Freq: Once | INTRAVENOUS | Status: AC
  Administered 2017-06-22: 1000 mL via INTRAVENOUS

## 2017-06-22 MED ORDER — IBUPROFEN 600 MG PO TABS: 600.0000 mg | ORAL_TABLET | Freq: Four times a day (QID) | ORAL | 0 refills | Status: DC | PRN

## 2017-06-22 MED ORDER — ONDANSETRON 8 MG PO TBDP: 8.0000 mg | ORAL_TABLET | Freq: Three times a day (TID) | ORAL | 0 refills | Status: DC | PRN

## 2017-06-22 MED ORDER — HYDROCODONE-ACETAMINOPHEN 5-325 MG PO TABS
1.0000 | ORAL_TABLET | Freq: Once | ORAL | Status: AC
  Administered 2017-06-22: 1 via ORAL
  Filled 2017-06-22: qty 1

## 2017-06-22 NOTE — ED Provider Notes (Signed)
[facility] DEPT Provider Note   CSN: [id number] Arrival date & time: 06/22/17  7829     History   Chief Complaint Chief Complaint  Patient presents with  . Flank Pain    HPI Blake Jordan is a 36 y.o. male.  HPI  Pt with renal stones comes in with flank pain. Pt states that his pain woke him up from sleep 3 hours ago. Pain has been constant, with waxing and waning intensity and it starts in the R flank region and moves down towards the groin. Pt has had renal stones and the pain feels similar. Pt has mild nausea. Pt also has hx of GERD, HL and is being workedup for IBS. Pt has no dysuria, hematuria. Pt has passed stones by himself in the past.  Past Medical History:  Diagnosis Date  . GERD (gastroesophageal reflux disease)   . Glaucoma    potentially will have - needs eye drops  . History of kidney stones   . Hypercholesterolemia     There are no active problems to display for this patient.   Past Surgical History:  Procedure Laterality Date  . None         Home Medications    Prior to Admission medications   Medication Sig Start Date End Date Taking? Authorizing Provider  hyoscyamine (LEVSIN SL) 0.125 MG SL tablet Place 1 tablet (0.125 mg total) under the tongue every 4 (four) hours as needed. 06/04/17   Gatha Mayer, MD  latanoprost (XALATAN) 0.005 % ophthalmic solution 1 drop at bedtime.    [provider]  omeprazole (PRILOSEC) 40 MG capsule daily.  04/26/17   [provider]  polyethylene glycol (MIRALAX / GLYCOLAX) packet Take 17 g by mouth daily. hasnt used in a week    [provider]    Family History Family History  Problem Relation Age of Onset  . Pancreatic cancer Mother   . Heart disease Father   . COPD Father   . Hypertension Father   . Heart disease Brother   . Diabetes Maternal Grandmother   . Cirrhosis Maternal Aunt     Social History Social History  Substance Use Topics  . Smoking status: Former  Research scientist (life sciences)  . Smokeless tobacco: Former Systems developer  . Alcohol use No     Allergies   Patient has no known allergies.   Review of Systems Review of Systems  Constitutional: Positive for activity change.  Cardiovascular: Negative for chest pain.  Gastrointestinal: Positive for abdominal pain.  Genitourinary: Positive for flank pain. Negative for dysuria, frequency and hematuria.     Physical Exam Updated Vital Signs BP 132/78 (BP Location: Right Arm)   Pulse (!) 56   Temp (!) 97.5 F (36.4 C) (Oral)   Resp 18   Ht 5\' 11"  (1.803 m)   Wt 89.8 kg (198 lb)   SpO2 100%   BMI 27.62 kg/m   Physical Exam  Constitutional: He is oriented to person, place, and time. He appears well-developed.  HENT:  Head: Atraumatic.  Neck: Neck supple.  Cardiovascular: Normal rate.   Pulmonary/Chest: Effort normal.  Abdominal: Soft. There is no tenderness. There is no guarding.  Neurological: He is alert and oriented to person, place, and time.  Skin: Skin is warm.  Nursing note and vitals reviewed.    ED Treatments / Results  Labs (all labs ordered are listed, but only abnormal results are displayed) Labs Reviewed  CBC WITH DIFFERENTIAL/PLATELET  COMPREHENSIVE METABOLIC PANEL  URINALYSIS, ROUTINE  W REFLEX MICROSCOPIC    EKG  EKG Interpretation None       Radiology No results found.  Procedures Procedures (including critical care time)  Medications Ordered in ED Medications  ketorolac (TORADOL) 30 MG/ML injection 15 mg (not administered)  sodium chloride 0.9 % bolus 1,000 mL (1,000 mLs Intravenous New Bag/Given 06/22/17 0722)  ondansetron (ZOFRAN) injection 4 mg (4 mg Intravenous Given 06/22/17 5364)     Initial Impression / Assessment and Plan / ED Course  I have reviewed the triage vital signs and the nursing notes.  Pertinent labs & imaging results that were available during my care of the patient were reviewed by me and considered in my medical decision making (see chart  for details).     Pt comes in with cc of flank pain. Pt reports hx of renal stones. He has no RUQ tenderness on exam and neg McBurney's. US renal ordered. Pt is comfortable appearing. There is no uti like symptoms. VSS and WNL. Basic labs ordered and pending.  If [place] is neg, unless the labs are concerning, I don't anticipate CT.   Final Clinical Impressions(s) / ED Diagnoses   Final diagnoses:  None    New Prescriptions New Prescriptions   No medications on file     Varney Biles, MD 06/22/17 [phone]

## 2017-06-22 NOTE — Discharge Instructions (Signed)
We saw you in the ER for the abdominal pain. °Our results indicate that you have a kidney stone. °We were able to get your pain is relative control, and we can safely send you home. ° °Take the meds prescribed. °Set up an appointment with the Urologist. °If the pain is unbearable, you start having fevers, chills, and are unable to keep any meds down - then return to the ER. °  °

## 2017-06-22 NOTE — ED Triage Notes (Signed)
Pt reports woke up with r sided flank pain this morning.  Reports history of kidney stones.  Denies n/v/d, denies problems urinating.  Pt also says has been having " a lot of stomach problems recently."

## 2017-07-05 ENCOUNTER — Ambulatory Visit: Payer: BLUE CROSS/BLUE SHIELD | Admitting: Cardiovascular Disease

## 2017-07-08 ENCOUNTER — Ambulatory Visit (INDEPENDENT_AMBULATORY_CARE_PROVIDER_SITE_OTHER): Payer: BLUE CROSS/BLUE SHIELD | Admitting: Cardiology

## 2017-07-08 ENCOUNTER — Telehealth: Payer: Self-pay | Admitting: Cardiology

## 2017-07-08 ENCOUNTER — Encounter: Payer: Self-pay | Admitting: Cardiology

## 2017-07-08 VITALS — BP 122/80 | HR 96 | Ht 71.0 in | Wt 202.8 lb

## 2017-07-08 DIAGNOSIS — K219 Gastro-esophageal reflux disease without esophagitis: Secondary | ICD-10-CM

## 2017-07-08 DIAGNOSIS — R002 Palpitations: Secondary | ICD-10-CM | POA: Diagnosis not present

## 2017-07-08 DIAGNOSIS — R0789 Other chest pain: Secondary | ICD-10-CM

## 2017-07-08 DIAGNOSIS — F17201 Nicotine dependence, unspecified, in remission: Secondary | ICD-10-CM | POA: Diagnosis not present

## 2017-07-08 NOTE — Patient Instructions (Signed)
Medication Instructions:  Your physician recommends that you continue on your current medications as directed. Please refer to the Current Medication list given to you today.  Labwork: none  Testing/Procedures: Your physician has recommended that you wear an event monitor FOR 7 DAYS. Event monitors are medical devices that record the heart's electrical activity. Doctors most often [place] these monitors to diagnose arrhythmias. Arrhythmias are problems with the speed or rhythm of the heartbeat. The monitor is a small, portable device. You can wear one while you do your normal daily activities. This is usually used to diagnose what is causing palpitations/syncope (passing out).  Your physician has requested that you have a stress echocardiogram. For further information please visit [email or web address]. Please follow instruction sheet as given.  Follow-Up: Your physician recommends that you schedule a follow-up appointment PENDING TEST RESULTS  Any Other Special Instructions Will Be Listed Below (If Applicable).  If you need a refill on your cardiac medications before your next appointment, please call your pharmacy.

## 2017-07-08 NOTE — Progress Notes (Signed)
Cardiology Office Note  Date: 07/08/2017   ID: Blake Jordan, DOB 1981/11/12, MRN [id number]  PCP: Blake Euler, MD  Consulting Cardiologist: Blake Lesches, MD   Chief Complaint  Patient presents with  . Palpitations    History of Present Illness: Blake Jordan is a 36 y.o. male referred for cardiology consultation by Blake Jordan for the assessment of chest discomfort and palpitations. He states that over the last several months he has been experiencing a left-sided dull chest discomfort, unusual feeling in his medial left upper arm and sometimes posteriorly around the shoulder blade. He does not identify any specific trigger for these symptoms, can last from minutes to hours. He works in Architect, fairly physical labor, unclear that there is a correlation between what he is doing to the symptoms however. Unrelated to the chest discomfort he has also had recurring episodes of a feeling of rapid heartbeat, tends to happen in the evenings when he is still. He has had no syncope.  He does have a history of hyperlipidemia, last LDL was 151. Currently trying to work on diet. He is not on a statin. He does have a family history of heart disease in his brother and father. Otherwise no personal history of diabetes mellitus or hypertension. He quit smoking about 3 years ago.  Past Medical History:  Diagnosis Date  . GERD (gastroesophageal reflux disease)   . Glaucoma    potentially will have - needs eye drops  . History of kidney stones   . Hypercholesterolemia     Past Surgical History:  Procedure Laterality Date  . None      Current Outpatient Prescriptions  Medication Sig Dispense Refill  . HYDROcodone-acetaminophen (NORCO/VICODIN) 5-325 MG tablet Take 1 tablet by mouth every 8 (eight) hours as needed for severe pain. 8 tablet 0  . ibuprofen (ADVIL,MOTRIN) 600 MG tablet Take 1 tablet (600 mg total) by mouth every 6 (six) hours as needed. 30 tablet 0  . latanoprost  (XALATAN) 0.005 % ophthalmic solution 1 drop at bedtime.    Marland Kitchen omeprazole (PRILOSEC) 40 MG capsule Take 40 mg by mouth as needed.   0  . ondansetron (ZOFRAN ODT) 8 MG disintegrating tablet Take 1 tablet (8 mg total) by mouth every 8 (eight) hours as needed for nausea. 20 tablet 0  . polyethylene glycol (MIRALAX / GLYCOLAX) packet Take 17 g by mouth every other day. hasnt used in a week      No current facility-administered medications for this visit.    Allergies:  Patient has no known allergies.   Social History: The patient  reports that he quit smoking about 3 years ago. He quit smokeless tobacco use about 19 months ago. He reports that he does not drink alcohol or use drugs.   Family History: The patient's family history includes COPD in his father; Cirrhosis in his maternal aunt; Diabetes in his maternal grandmother; Heart disease in his brother and father; Hypertension in his father; Pancreatic cancer in his mother.   ROS:  Please see the history of present illness. Otherwise, complete review of systems is positive for occasional burning sensation in the back of his legs, nonexertional.  All other systems are reviewed and negative.   Physical Exam: VS:  BP 122/80 (BP Location: Right Arm, Cuff Size: Normal)   Pulse 96   Ht 5\' 11"  (1.803 m)   Wt 202 lb 12.8 oz (92 kg)   SpO2 97%   BMI 28.28 kg/m , BMI Body mass  index is 28.28 kg/m.  Wt Readings from Last 3 Encounters:  07/08/17 202 lb 12.8 oz (92 kg)  06/22/17 198 lb (89.8 kg)  06/04/17 200 lb 4 oz (90.8 kg)    General: Patient appears comfortable at rest. HEENT: Conjunctiva and lids normal, oropharynx clear. Neck: Supple, no elevated JVP or carotid bruits, no thyromegaly. Lungs: Clear to auscultation, nonlabored breathing at rest. Cardiac: Regular rate and rhythm, no S3 or significant systolic murmur, no pericardial rub. Abdomen: Soft, nontender, bowel sounds present, no guarding or rebound. Extremities: No pitting edema,  distal pulses 2+. Skin: Warm and dry. Musculoskeletal: No kyphosis. Neuropsychiatric: Alert and oriented x3, affect grossly appropriate.  ECG: I personally reviewed the tracing from 04/27/2017 which showed sinus rhythm, possible left atrial enlargement. Borderline short PR interval.  Recent Labwork: 02/27/2017: TSH 2.060 06/22/2017: ALT 36; AST 22; BUN 20; Creatinine, Ser 1.13; Hemoglobin 14.9; Platelets 237; Potassium 3.7; Sodium 142     Component Value Date/Time   CHOL 207 (H) 02/27/2017 1007   TRIG 65 02/27/2017 1007   HDL 43 02/27/2017 1007   CHOLHDL 4.8 02/27/2017 1007   LDLCALC 151 (H) 02/27/2017 1007   Assessment and Plan:  1. Atypical chest discomfort as outlined above. ECG shows no significant ST segment changes, borderline short PR interval. He does have a family history of CAD in his brother and father, personal history of hyperlipidemia and prior tobacco use. Plan is to evaluate further with ischemic testing, will arrange an exercise echocardiogram.  2. Intermittent palpitations without syncope, not specifically related to the chest discomfort described above. We will obtain a 7 day event recorder to further investigate. States that the symptoms tend to happen more the evenings.  3. Tobacco abuse in remission.  4. GERD on Prilosec.  Current medicines were reviewed with the patient today.   Orders Placed This Encounter  Procedures  . Cardiac event monitor  . ECHOCARDIOGRAM STRESS TEST    Disposition: Call with test results.  Signed, Blake Sark, MD, [facility] 07/08/2017 3:26 PM    [facility], [place], [place] [address] Phone: [phone]; Fax: [phone]

## 2017-07-08 NOTE — Telephone Encounter (Signed)
Stress echo scheduled Sept 17, 2018 @ [facility]

## 2017-07-15 ENCOUNTER — Encounter (INDEPENDENT_AMBULATORY_CARE_PROVIDER_SITE_OTHER): Payer: BLUE CROSS/BLUE SHIELD

## 2017-07-15 DIAGNOSIS — R002 Palpitations: Secondary | ICD-10-CM | POA: Diagnosis not present

## 2017-07-23 ENCOUNTER — Telehealth: Payer: Self-pay

## 2017-07-23 DIAGNOSIS — H401131 Primary open-angle glaucoma, bilateral, mild stage: Secondary | ICD-10-CM | POA: Diagnosis not present

## 2017-07-23 NOTE — Telephone Encounter (Signed)
-----   Message from Merlene Laughter, LPN sent at 06/11/9469  7:38 AM EDT -----   ----- Message ----- From: Satira Sark, MD Sent: 07/22/2017  10:56 AM To: Merlene Laughter, LPN, Timmothy Euler, MD  Results reviewed. Please let him know that there were no specific arrhythmias noted on his cardiac monitor, awaiting stress test results. A copy of this test should be forwarded to Timmothy Euler, MD.

## 2017-07-23 NOTE — Telephone Encounter (Signed)
Patients wife returned call regarding monitor results. Wife notified. Patient advised to contact office with any questions

## 2017-07-23 NOTE — Telephone Encounter (Signed)
Patients wife notified. Routed to PCP  

## 2017-07-23 NOTE — Telephone Encounter (Signed)
-----   Message from Merlene Laughter, LPN sent at 4/60/4799  7:38 AM EDT -----   ----- Message ----- From: Satira Sark, MD Sent: 07/22/2017  10:56 AM To: Merlene Laughter, LPN, Timmothy Euler, MD  Results reviewed. Please let him know that there were no specific arrhythmias noted on his cardiac monitor, awaiting stress test results. A copy of this test should be forwarded to Timmothy Euler, MD.

## 2017-08-09 ENCOUNTER — Ambulatory Visit (HOSPITAL_COMMUNITY)
Admission: RE | Admit: 2017-08-09 | Discharge: 2017-08-09 | Disposition: A | Discharge status: Home / Self Care | Payer: BLUE CROSS/BLUE SHIELD | Source: Ambulatory Visit | Attending: Cardiology | Admitting: Cardiology

## 2017-08-09 ENCOUNTER — Telehealth: Payer: Self-pay

## 2017-08-09 DIAGNOSIS — R000 Tachycardia, unspecified: Secondary | ICD-10-CM | POA: Diagnosis not present

## 2017-08-09 DIAGNOSIS — R002 Palpitations: Secondary | ICD-10-CM | POA: Insufficient documentation

## 2017-08-09 LAB — ECHOCARDIOGRAM STRESS TEST
Estimated workload: 13.4 METS
Exercise duration (min): 10 min
Exercise duration (sec): 47 s
MPHR: 186 {beats}/min
Peak HR: 179 {beats}/min
Percent HR: 96 %
RPE: 13
Rest HR: 67 {beats}/min

## 2017-08-09 NOTE — Telephone Encounter (Signed)
-----   Message from Satira Sark, MD sent at 08/09/2017 11:27 AM EDT ----- Results reviewed. Overall reassuring study. The equivocal ST segment changes are noncontributory in light of vigorous LVEF and no evidence of ischemia by echocardiogram. This is a low risk study for obstructive CAD. A copy of this test should be forwarded to Timmothy Euler, MD.

## 2017-08-09 NOTE — Telephone Encounter (Signed)
Patient states he would like to discuss with PCP first.

## 2017-08-09 NOTE — Telephone Encounter (Signed)
Patient notified and verbalized understanding. Copy sent to PCP. Patient states he is still having palpitations and chest tightness that wakes him up in his sleep. No shortness of breath or any other symptoms. Patient would like to know when he should follow up or what to do next.

## 2017-08-09 NOTE — Progress Notes (Signed)
*  PRELIMINARY RESULTS* Echocardiogram Echocardiogram Stress Test has been performed.  Leavy Cella 08/09/2017, 10:41 AM

## 2017-08-09 NOTE — Telephone Encounter (Signed)
I think that is also a reasonable option since we did not turn up anything on testing so far. Symptoms could certainly be related to something non-cardiac.

## 2017-08-09 NOTE — Telephone Encounter (Signed)
This is unlikely to be related to obstructive CAD based on his stress test results. The seven-day cardiac monitor that he wore did not uncover any obvious arrhythmias either. If he is continuing to have symptoms, we could do a longer-term monitor such as a 14 day event recorder.

## 2017-08-17 ENCOUNTER — Encounter: Payer: Self-pay | Admitting: Family Medicine

## 2017-08-17 ENCOUNTER — Ambulatory Visit (INDEPENDENT_AMBULATORY_CARE_PROVIDER_SITE_OTHER): Payer: BLUE CROSS/BLUE SHIELD | Admitting: Family Medicine

## 2017-08-17 VITALS — BP 119/77 | HR 56 | Temp 97.8°F | Ht 71.0 in | Wt 200.4 lb

## 2017-08-17 DIAGNOSIS — F419 Anxiety disorder, unspecified: Secondary | ICD-10-CM

## 2017-08-17 DIAGNOSIS — R12 Heartburn: Secondary | ICD-10-CM | POA: Diagnosis not present

## 2017-08-17 DIAGNOSIS — R0789 Other chest pain: Secondary | ICD-10-CM | POA: Diagnosis not present

## 2017-08-17 MED ORDER — ESCITALOPRAM OXALATE 10 MG PO TABS: 10.0000 mg | ORAL_TABLET | Freq: Every day | ORAL | 5 refills | Status: DC
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
## 2017-08-17 NOTE — Patient Instructions (Signed)
Great to see you!  Come back in 3-4 weeks unless you need [place] sooner.   Start lexapro, 1 pill once daily for anxiety, it is a slow medication and may take a few weeks to see results.

## 2017-08-24 ENCOUNTER — Encounter: Payer: Self-pay | Admitting: Family Medicine

## 2017-08-24 ENCOUNTER — Encounter: Payer: Self-pay | Admitting: Internal Medicine

## 2017-08-24 DIAGNOSIS — M542 Cervicalgia: Secondary | ICD-10-CM

## 2017-08-25 ENCOUNTER — Telehealth: Payer: Self-pay | Admitting: Internal Medicine

## 2017-08-25 NOTE — Telephone Encounter (Signed)
-----   Message from Gatha Mayer, MD sent at 08/25/2017  4:30 PM EDT ----- Regarding: needs direct EGD/previsit please Please contact him and set up direct EGD  Dxs: GERD Upper abdominal pain  Thank you  CEG

## 2017-08-25 NOTE — Telephone Encounter (Signed)
Left message for patient to return my call.

## 2017-08-27 ENCOUNTER — Encounter: Payer: Self-pay | Admitting: Internal Medicine

## 2017-09-09 DIAGNOSIS — H1013 Acute atopic conjunctivitis, bilateral: Secondary | ICD-10-CM | POA: Diagnosis not present

## 2017-09-14 ENCOUNTER — Encounter: Payer: Self-pay | Admitting: Family Medicine

## 2017-09-16 ENCOUNTER — Encounter (INDEPENDENT_AMBULATORY_CARE_PROVIDER_SITE_OTHER): Payer: Self-pay | Admitting: Orthopaedic Surgery

## 2017-09-16 ENCOUNTER — Ambulatory Visit (INDEPENDENT_AMBULATORY_CARE_PROVIDER_SITE_OTHER): Payer: BLUE CROSS/BLUE SHIELD

## 2017-09-16 ENCOUNTER — Ambulatory Visit (INDEPENDENT_AMBULATORY_CARE_PROVIDER_SITE_OTHER): Payer: BLUE CROSS/BLUE SHIELD | Admitting: Orthopaedic Surgery

## 2017-09-16 VITALS — BP 121/74 | HR 65 | Ht 71.0 in | Wt 200.0 lb

## 2017-09-16 DIAGNOSIS — M542 Cervicalgia: Secondary | ICD-10-CM | POA: Diagnosis not present

## 2017-09-16 MED ORDER — PREDNISONE 10 MG PO TABS: 10.0000 mg | ORAL_TABLET | Freq: Every day | ORAL | 0 refills | Status: DC
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
## 2017-09-16 NOTE — Addendum Note (Signed)
Addended by: Meyer Cory on: 09/16/2017 04:47 PM   Modules accepted: Orders

## 2017-09-28 ENCOUNTER — Ambulatory Visit: Payer: BLUE CROSS/BLUE SHIELD | Attending: Orthopaedic Surgery | Admitting: Physical Therapy

## 2017-09-28 ENCOUNTER — Encounter: Payer: Self-pay | Admitting: Physical Therapy

## 2017-09-28 DIAGNOSIS — R293 Abnormal posture: Secondary | ICD-10-CM | POA: Diagnosis not present

## 2017-09-28 DIAGNOSIS — M542 Cervicalgia: Secondary | ICD-10-CM | POA: Diagnosis not present
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
## 2017-09-28 NOTE — Patient Instructions (Signed)
[facility]  DRY NEEDLING CONSENT FORM   Trigger point dry needling is a physical therapy approach to treat Myofascial Pain and Dysfunction.  Dry Needling (DN) is a valuable and effective way to deactivate myofascial trigger points (muscle knots/pain). It is skilled intervention that uses a thin filiform needle to penetrate the skin and stimulate underlying myofascial trigger points, muscular, and connective tissues for the management of neuromusculoskeletal pain and movement impairments.  A local twitch response (LTR) will be elicited.  This can sometimes feel like a deep ache in the muscle during the procedure. Multiple trigger points in multiple muscles can be treated during each treatment.  No medication of any kind is injected.   As with any medical treatment and procedure, there are possible adverse events.  While significant adverse events are uncommon, they do sometimes occur and must be considered prior to giving consent.  1. Dry needling often causes a "post needling soreness".  There can be an increase in pain from a couple of hours to 2-3 days, followed by an improvement in the overall pain state. 2. Any time a needle is used there is a risk of infection.  However, we are using new, sterile, and disposable needles; infections are extremely rare. 3. There is a possibility that you may bleed or bruise.  You may feel tired and some nausea following treatment. 4. There is a rare possibility of a pneumothorax (air in the chest cavity). 5. Allergic reaction to nickel in the stainless steel needle. 6. If a nerve is touched, it may cause paresthesia (a prickling/shock sensation) which is usually brief, but may continue for a couple of days.  Following treatment stay hydrated.  Continue regular activities but not too vigorous initially after treatment for 24-48 hours.  Dry Needling is best when combined with other physical therapy interventions such as  strengthening, stretching and other therapeutic modalities.   PLEASE ANSWER THE FOLLOWING QUESTIONS:  Do you have a lack of sensation?   Y/N  Do you have a phobia or fear of needles  Y/N  Are you pregnant?    Y/N If yes:  How many weeks? __________ Do you have any implanted devices?  Y/N If yes:  Pacemaker/Spinal Cord Stimulator/Deep Brain Stimulator/Insulin Pump/Other: ________________ Do you have any implants?  Y/N If yes: Breast/Facial/Pecs/Buttocks/Calves/Hip  Replacement/ Knee Replacement/Other: _________ Do you take any blood thinners?   Y/N If yes: Coumadin (Warfarin)/Other: ___________________ Do you have a bleeding disorder?   Y/N If yes: What kind: _________________________________ Do you take any immunosuppressants?  Y/N If yes:   What kind: _________________________________ Do you take anti-inflammatories?   Y/N If yes: What kind: Advil/Aspirin/Other: ________________ Have you ever been diagnosed with Scoliosis? Y/N Have you had back surgery?   Y/N If yes:  Laminectomy/Fusion/Other: ___________________   I have read, or had read to me, the above.  I have had the opportunity to ask any questions.  All of my questions have been answered to my satisfaction and I understand the risks involved with dry needling.  I consent to examination and treatment at [facility], including dry needling, of any and all of my involved and affected muscles.  

## 2017-09-28 NOTE — Therapy (Signed)
[facility] [place], [place], [address] Phone: [phone]   Fax:  [phone]  Physical Therapy Evaluation  Patient Details  Name: Blake Jordan MRN: [id number] Date of Birth: 22-Jun-1981 Referring Provider: Rodell Perna MD   Encounter Date: 09/28/2017  PT End of Session - 09/28/17 1526    Visit Number  1    Number of Visits  12    Date for PT Re-Evaluation  11/09/17    Authorization Type  FOTO every 5th visit.    PT Start Time  0230    PT Stop Time  0319    PT Time Calculation (min)  49 min    Activity Tolerance  Patient tolerated treatment well    Behavior During Therapy  WFL for tasks assessed/performed       Past Medical History:  Diagnosis Date  . GERD (gastroesophageal reflux disease)   . Glaucoma    potentially will have - needs eye drops  . History of kidney stones   . Hypercholesterolemia     Past Surgical History:  Procedure Laterality Date  . None      There were no vitals filed for this visit.   Subjective Assessment - 09/28/17 1532    Subjective  The patient reports left sided neck pain that radiates into his left shoulder and below the level of his elbow.  His pain-level today is a 5/10 but can go to higher levels with certain movements and strenuous activity.  He has also had pain into his chest and had testing to rule out anything of a cardiac nature.  He thinks his pain started about a year ago after a workout.    The patient had an X-ray that revealed:  Impression: Cervical spine x-rays negative for acute changes. Mild narrowing at C5-6.  The patient further reports that during his ortho assessment the MD did a test in which he bent his head sideways and this reproduced his symptoms.  He has had some headaches.    Pertinent History  Whiplash 2016 (Chiropratic care).    Diagnostic tests  X-ray.    Patient Stated Goals  Get out of pain.    Currently in Pain?  Yes    Pain Score  5     Pain  Location  Neck    Pain Orientation  Left    Pain Descriptors / Indicators  Sharp    Pain Type  Chronic pain    Pain Radiating Towards  Left UE.    Pain Onset  More than a month ago    Pain Frequency  Constant    Aggravating Factors   See above.    Pain Relieving Factors  See above.         [facility] PT Assessment - 09/28/17 0001      Assessment   Medical Diagnosis  Neck pain.    Referring Provider  Rodell Perna MD    Onset Date/Surgical Date  -- One year.   One year.     Precautions   Precautions  None      Restrictions   Weight Bearing Restrictions  No      Balance Screen   Has the patient fallen in the past 6 months  No    Has the patient had a decrease in activity level because of a fear of falling?   No    Is the patient reluctant to leave their home because of a fear of falling?   No  [place] residence      Prior Function   Level of Independence  Independent      Posture/Postural Control   Posture/Postural Control  Postural limitations    Postural Limitations  Rounded Shoulders;Forward head      ROM / Strength   AROM / PROM / Strength  AROM;Strength      AROM   Overall AROM Comments  Bilateral active cervical rotation= 65 degrees.      Strength   Overall Strength Comments  Normal bilateral UE DTR's.      Palpation   Palpation comment  Tender to palpation over left UT that is very taut to palpation.  He also had tenderness over levator scapulae and left upper thoracic region.      Special Tests    Special Tests  -- Diminished bilateral Bicep DTR's.   Diminished bilateral Bicep DTR's.     Ambulation/Gait   Gait Comments  WNL.             Objective measurements completed on examination: See above findings.      [facility] Adult PT Treatment/Exercise - 09/28/17 0001      Modalities   Modalities  Electrical Stimulation;Moist Heat      Moist Heat Therapy   Number Minutes Moist Heat  20 Minutes    Moist Heat  Location  Cervical      Electrical Stimulation   Electrical Stimulation Location  left cervical/upper thoracic.    Electrical Stimulation Action  IFC    Electrical Stimulation Parameters  80-150 Hz x 20 minutes.    Electrical Stimulation Goals  Tone;Pain             PT Education - 09/28/17 1609    Education provided  Yes    Education Details  Chin tucks and cervical extension.    Person(s) Educated  Patient    Methods  Explanation;Demonstration    Comprehension  Verbalized understanding;Need further instruction          PT Long Term Goals - 09/28/17 1612      PT LONG TERM GOAL #1   Title  Independent with a HEP.    Time  6    Period  Weeks    Status  New      PT LONG TERM GOAL #2   Title  Increase active cervical rotation to 75 degrees+ so patient can turn head more easily while driving.    Time  6    Period  Weeks    Status  New      PT LONG TERM GOAL #3   Title  Eliminate UE symptoms.    Time  6    Period  Weeks    Status  New      PT LONG TERM GOAL #4   Title  Perform ADL's/work activites with pain not > 2/10.    Time  6    Period  Weeks    Status  New             Plan - 09/28/17 1606    Clinical Impression Statement  The patient has a year long h/o left sided neck pain with radiation into into left UE to his elbow.  His cervical range of motion is limited.  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  He has an active  TP in his left UT.  Patient will benefit from skilled physical therapy to address pain and deficts.    History and Personal Factors relevant to plan of care:  Whiplash in 2016 that rrequired Chiropractic treatment.    Clinical Presentation  Evolving    Clinical Presentation due to:  Not improving.    Clinical Decision Making  Low    Rehab Potential  Good    PT Frequency  2x / week    PT Duration  6 weeks    PT Treatment/Interventions   ADLs/Self Care Home Management;Cryotherapy;Electrical Stimulation;Ultrasound;Traction;Moist Heat;Therapeutic activities;Therapeutic exercise;Patient/family education;Manual techniques;Passive range of motion;Dry needling    PT Next Visit Plan  Int traction at 17# with max at 25-26 based on patient tolerance; dry needling.  Chin tucks and extension.  Combo e'stim/U/S; HMP and e'stim.  STW/M.    Consulted and Agree with Plan of Care  Patient       Patient will benefit from skilled therapeutic intervention in order to improve the following deficits and impairments:  Postural dysfunction, Decreased range of motion  Visit Diagnosis: Cervicalgia - Plan: PT plan of care cert/re-cert  Abnormal posture - Plan: PT plan of care cert/re-cert     Problem List Patient Active Problem List   Diagnosis Date Noted  . Anxiety 08/17/2017    Margaretha Mahan, [place] MPT 09/28/2017, 4:19 PM  [facility] [address] [place], [place], [address] Phone: [phone]   Fax:  [phone]  Name: Yasin Ducat MRN: [id number] Date of Birth: January 08, 1981

## 2017-10-05 ENCOUNTER — Ambulatory Visit: Payer: BLUE CROSS/BLUE SHIELD | Admitting: Physical Therapy

## 2017-10-05 ENCOUNTER — Encounter: Payer: Self-pay | Admitting: Physical Therapy

## 2017-10-05 DIAGNOSIS — R293 Abnormal posture: Secondary | ICD-10-CM

## 2017-10-05 DIAGNOSIS — M542 Cervicalgia: Secondary | ICD-10-CM

## 2017-10-05 NOTE — Therapy (Signed)
[facility] [place], [place], [address] Phone: [phone]   Fax:  [phone]  Physical Therapy Treatment  Patient Details  Name: Blake Jordan MRN: [id number] Date of Birth: Sep 26, 1981 Referring Provider: Rodell Perna MD   Encounter Date: 10/05/2017  PT End of Session - 10/05/17 1739    Visit Number  2    Number of Visits  12    Date for PT Re-Evaluation  11/09/17    Authorization Type  FOTO every 5th visit.    PT Start Time  0400    PT Stop Time  0451    PT Time Calculation (min)  51 min    Activity Tolerance  Patient tolerated treatment well    Behavior During Therapy  WFL for tasks assessed/performed       Past Medical History:  Diagnosis Date  . GERD (gastroesophageal reflux disease)   . Glaucoma    potentially will have - needs eye drops  . History of kidney stones   . Hypercholesterolemia     Past Surgical History:  Procedure Laterality Date  . None      There were no vitals filed for this visit.  Subjective Assessment - 10/05/17 1731    Subjective  No new complaints.    Pain Score  5     Pain Location  Neck    Pain Orientation  Left    Pain Descriptors / Indicators  Sharp    Pain Onset  More than a month ago                      [facility] Adult PT Treatment/Exercise - 10/05/17 0001      Modalities   Modalities  Electrical Stimulation;Moist Heat;Traction;Ultrasound      Moist Heat Therapy   Number Minutes Moist Heat  -- 20 minutes.    Moist Heat Location  Cervical      Electrical Stimulation   Electrical Stimulation Location  -- Left cervical/left upper thoracic.    Electrical Stimulation Action  Pre-mod    Electrical Stimulation Parameters  80-150 Hz x 20 minutes.  Constant to thoracic region and 5 sec on and 5 sec off to left cervical region.    Electrical Stimulation Goals  Tone;Pain      Ultrasound   Ultrasound Location  -- Left cervical region.    Ultrasound  Parameters  1.50 W/CM2 x 8 minutes.    Ultrasound Goals  Pain      Traction   Type of Traction  Cervical    Min (lbs)  5    Max (lbs)  17    Hold Time  99    Rest Time  5    Time  15                  PT Long Term Goals - 09/28/17 1612      PT LONG TERM GOAL #1   Title  Independent with a HEP.    Time  6    Period  Weeks    Status  New      PT LONG TERM GOAL #2   Title  Increase active cervical rotation to 75 degrees+ so patient can turn head more easily while driving.    Time  6    Period  Weeks    Status  New      PT LONG TERM GOAL #3   Title  Eliminate UE symptoms.    Time  6    Period  Weeks    Status  New      PT LONG TERM GOAL #4   Title  Perform ADL's/work activites with pain not > 2/10.    Time  6    Period  Weeks    Status  New            Plan - 10/05/17 1739    Clinical Impression Statement  Good response to treatment today with patient stating he felt better following.    PT Treatment/Interventions  ADLs/Self Care Home Management;Cryotherapy;Electrical Stimulation;Ultrasound;Traction;Moist Heat;Therapeutic activities;Therapeutic exercise;Patient/family education;Manual techniques;Passive range of motion;Dry needling       Patient will benefit from skilled therapeutic intervention in order to improve the following deficits and impairments:  Postural dysfunction, Decreased range of motion  Visit Diagnosis: Cervicalgia  Abnormal posture     Problem List Patient Active Problem List   Diagnosis Date Noted  . Anxiety 08/17/2017    Jannett Schmall, [place] MPT 10/05/2017, 5:44 PM  [facility] [address] [place], [place], [address] Phone: [phone]   Fax:  [phone]  Name: Blake Jordan MRN: [id number] Date of Birth: 01/01/1981

## 2017-10-11 ENCOUNTER — Encounter: Payer: Self-pay | Admitting: Physical Therapy

## 2017-10-11 ENCOUNTER — Ambulatory Visit: Payer: BLUE CROSS/BLUE SHIELD | Admitting: Physical Therapy

## 2017-10-11 DIAGNOSIS — M542 Cervicalgia: Secondary | ICD-10-CM

## 2017-10-11 DIAGNOSIS — R293 Abnormal posture: Secondary | ICD-10-CM

## 2017-10-11 NOTE — Therapy (Signed)
[facility] [place], [place], [address] Phone: [phone]   Fax:  [phone]  Physical Therapy Treatment  Patient Details  Name: Blake Jordan MRN: [id number] Date of Birth: 1981-09-16 Referring Provider: Rodell Perna MD   Encounter Date: 10/11/2017  PT End of Session - 10/11/17 1622    Visit Number  3    Number of Visits  12    Date for PT Re-Evaluation  11/09/17    Authorization Type  FOTO every 5th visit.    PT Start Time  0400    PT Stop Time  0453    PT Time Calculation (min)  53 min    Activity Tolerance  Patient tolerated treatment well    Behavior During Therapy  WFL for tasks assessed/performed       Past Medical History:  Diagnosis Date  . GERD (gastroesophageal reflux disease)   . Glaucoma    potentially will have - needs eye drops  . History of kidney stones   . Hypercholesterolemia     Past Surgical History:  Procedure Laterality Date  . None      There were no vitals filed for this visit.  Subjective Assessment - 10/11/17 1625    Subjective  I did fine after that last treatment.    Pain Score  4     Pain Location  Neck    Pain Orientation  Left    Pain Descriptors / Indicators  Aching;Sharp    Pain Type  Chronic pain    Pain Onset  More than a month ago                      [facility] Adult PT Treatment/Exercise - 10/11/17 0001      Moist Heat Therapy   Number Minutes Moist Heat  20 Minutes    Moist Heat Location  Cervical      Electrical Stimulation   Electrical Stimulation Location  -- LT cervical/UT.    Electrical Stimulation Action  IFC    Electrical Stimulation Parameters  80-150 Hz x 20 minutes.    Electrical Stimulation Goals  Tone;Pain      Ultrasound   Ultrasound Parameters  Combo e'stim/U/S at 1.50 W/CM2 x 8 minutes to left UT    Ultrasound Goals  Pain      Traction   Type of Traction  Cervical    Min (lbs)  5    Max (lbs)  18    Hold Time  99    Rest Time  5    Time  15      Manual Therapy   Manual therapy comments  -- IASTM x 2 minutes over left UT.                  PT Long Term Goals - 09/28/17 1612      PT LONG TERM GOAL #1   Title  Independent with a HEP.    Time  6    Period  Weeks    Status  New      PT LONG TERM GOAL #2   Title  Increase active cervical rotation to 75 degrees+ so patient can turn head more easily while driving.    Time  6    Period  Weeks    Status  New      PT LONG TERM GOAL #3   Title  Eliminate UE symptoms.    Time  6  Period  Weeks    Status  New      PT LONG TERM GOAL #4   Title  Perform ADL's/work activites with pain not > 2/10.    Time  6    Period  Weeks    Status  New            Plan - 10/11/17 1731    Clinical Impression Statement  Good response to treatment today.  Patient considering Dry needling to his left UT.    PT Treatment/Interventions  ADLs/Self Care Home Management;Cryotherapy;Electrical Stimulation;Ultrasound;Traction;Moist Heat;Therapeutic activities;Therapeutic exercise;Patient/family education;Manual techniques;Passive range of motion;Dry needling    PT Next Visit Plan  Int traction at 17# with max at 25-26 based on patient tolerance; dry needling.  Chin tucks and extension.  Combo e'stim/U/S; HMP and e'stim.  STW/M.    Consulted and Agree with Plan of Care  Patient       Patient will benefit from skilled therapeutic intervention in order to improve the following deficits and impairments:  Postural dysfunction, Decreased range of motion  Visit Diagnosis: Cervicalgia  Abnormal posture     Problem List Patient Active Problem List   Diagnosis Date Noted  . Anxiety 08/17/2017    Simara Rhyner, [place] MPT 10/11/2017, 5:33 PM  [facility] [address] [place], [place], [address] Phone: [phone]   Fax:  [phone]  Name: Blake Jordan MRN: [id number] Date of Birth: July 07, 1981

## 2017-10-12 ENCOUNTER — Ambulatory Visit (AMBULATORY_SURGERY_CENTER): Payer: Self-pay

## 2017-10-12 VITALS — Ht 71.0 in | Wt 204.0 lb

## 2017-10-12 DIAGNOSIS — K219 Gastro-esophageal reflux disease without esophagitis: Secondary | ICD-10-CM

## 2017-10-12 NOTE — Progress Notes (Signed)
No home oxygen No diet meds No allergies to eggs or soy No past problems/exposure to anesthesia  Registered emmi

## 2017-10-19 ENCOUNTER — Encounter: Payer: Self-pay | Admitting: Physical Therapy

## 2017-10-19 ENCOUNTER — Ambulatory Visit: Payer: BLUE CROSS/BLUE SHIELD | Admitting: Physical Therapy

## 2017-10-19 DIAGNOSIS — R293 Abnormal posture: Secondary | ICD-10-CM | POA: Diagnosis not present

## 2017-10-19 DIAGNOSIS — M542 Cervicalgia: Secondary | ICD-10-CM

## 2017-10-19 NOTE — Therapy (Addendum)
[facility] [place], [place], [address] Phone: [phone]   Fax:  [phone]  Physical Therapy Treatment  Patient Details  Name: Jeshawn Melucci MRN: [id number] Date of Birth: 1981/09/14 Referring Provider: Rodell Perna MD   Encounter Date: 10/19/2017  PT End of Session - 10/19/17 1733    Visit Number  4    Number of Visits  12    Date for PT Re-Evaluation  11/09/17    Authorization Type  FOTO every 5th visit.    PT Start Time  0445    PT Stop Time  0550    PT Time Calculation (min)  65 min    Activity Tolerance  Patient tolerated treatment well    Behavior During Therapy  [facility] for tasks assessed/performed       Past Medical History:  Diagnosis Date  . Fatty liver   . GERD (gastroesophageal reflux disease)   . Glaucoma    potentially will have - needs eye drops  . History of kidney stones   . Hypercholesterolemia     Past Surgical History:  Procedure Laterality Date  . CARDIOVASCULAR STRESS TEST    . DENTAL RESTORATION/EXTRACTION WITH X-RAY    . None      There were no vitals filed for this visit.  Subjective Assessment - 10/19/17 1712    Subjective  I'm hurting in my chest again.  Started about 2 days ago.  I did remove a transmission though.    Pain Score  5     Pain Location  Neck    Pain Orientation  Left                      [facility] Adult PT Treatment/Exercise - 10/19/17 0001      Modalities   Modalities  Electrical Stimulation;Moist Heat      Moist Heat Therapy   Number Minutes Moist Heat  20 Minutes    Moist Heat Location  -- Thoracic.      Acupuncturist Location  -- Left mid-thoracic.    Electrical Stimulation Action  Pre-mod (4 electrodes).    Electrical Stimulation Parameters  80-150 Hz x 20 minutes.    Electrical Stimulation Goals  Tone;Pain      Traction   Type of Traction  Cervical    Min (lbs)  5    Max (lbs)  20    Hold Time   99    Rest Time  5    Time  15      Manual Therapy   Manual Therapy  Soft tissue mobilization    Manual therapy comments  In prone:  STW/M to left mid-thoracic paraspinal musculature including gentle thoracic PA and costovertebral mobs x 8 minutes total.                  PT Long Term Goals - 09/28/17 1612      PT LONG TERM GOAL #1   Title  Independent with a HEP.    Time  6    Period  Weeks    Status  New      PT LONG TERM GOAL #2   Title  Increase active cervical rotation to 75 degrees+ so patient can turn head more easily while driving.    Time  6    Period  Weeks    Status  New      PT LONG TERM GOAL #3   Title  Eliminate UE symptoms.    Time  6    Period  Weeks    Status  New      PT LONG TERM GOAL #4   Title  Perform ADL's/work activites with pain not > 2/10.    Time  6    Period  Weeks    Status  New            Plan - 10/19/17 1734    Clinical Impression Statement  CC is left mid-thoracic muscle pain today.  He responsed very favorably to treatment today.  He continues to c/o left sided neck pain and at times some facial pain.         Patient will benefit from skilled therapeutic intervention in order to improve the following deficits and impairments:  Postural dysfunction, Decreased range of motion  Visit Diagnosis: Cervicalgia  Abnormal posture     Problem List Patient Active Problem List   Diagnosis Date Noted  . Anxiety 08/17/2017    APPLEGATE, [place] MPT 10/19/2017, 5:57 PM  [facility] [address] [place], [place], [address] Phone: [phone]   Fax:  [phone]  Name: Hashir Deleeuw MRN: [id number] Date of Birth: 12/17/1980  PHYSICAL THERAPY DISCHARGE SUMMARY  Visits from Start of Care: 4.  Current functional level related to goals / functional outcomes: See above.   Remaining deficits: See below.   Education / Equipment:  Plan: Patient agrees to discharge.   Patient goals were not met. Patient is being discharged due to not returning since the last visit.  ?????         Mali Applegate MPT

## 2017-10-21 ENCOUNTER — Encounter: Payer: Self-pay | Admitting: Internal Medicine

## 2017-10-21 ENCOUNTER — Ambulatory Visit (INDEPENDENT_AMBULATORY_CARE_PROVIDER_SITE_OTHER): Payer: BLUE CROSS/BLUE SHIELD | Admitting: Orthopaedic Surgery

## 2017-10-21 ENCOUNTER — Encounter (INDEPENDENT_AMBULATORY_CARE_PROVIDER_SITE_OTHER): Payer: Self-pay | Admitting: Orthopaedic Surgery

## 2017-10-21 VITALS — Ht 71.0 in | Wt 200.0 lb

## 2017-10-21 DIAGNOSIS — M542 Cervicalgia: Secondary | ICD-10-CM | POA: Diagnosis not present

## 2017-10-26 ENCOUNTER — Ambulatory Visit (AMBULATORY_SURGERY_CENTER): Payer: BLUE CROSS/BLUE SHIELD | Admitting: Internal Medicine

## 2017-10-26 ENCOUNTER — Encounter: Payer: Self-pay | Admitting: Internal Medicine

## 2017-10-26 VITALS — BP 132/72 | HR 63 | Temp 99.1°F | Resp 11 | Ht 71.0 in | Wt 204.0 lb

## 2017-10-26 DIAGNOSIS — K219 Gastro-esophageal reflux disease without esophagitis: Secondary | ICD-10-CM

## 2017-10-26 MED ORDER — SODIUM CHLORIDE 0.9 % IV SOLN: 500.0000 mL | INTRAVENOUS | Status: DC

## 2017-10-26 NOTE — Patient Instructions (Addendum)
There was some food still in the stomach,  Putting this together with your symptoms it raises the question of whether or not the stomach is emptying properly. There was no blockage, ulcer or tumor seen.  My staff will be arranging for you to have a gastric emptying study. Its a special xray study to determine if stomach function is normal.  I appreciate the opportunity to care for you. Gatha Mayer, MD, FACG   BE SURE TO TAKE YOUR [place].  YOU HAD AN ENDOSCOPIC PROCEDURE TODAY AT [facility]:   Refer to the procedure report that was given to you for any specific questions about what was found during the examination.  If the procedure report does not answer your questions, please call your gastroenterologist to clarify.  If you requested that your care partner not be given the details of your procedure findings, then the procedure report has been included in a sealed envelope for you to review at your convenience later.  YOU SHOULD EXPECT: Some feelings of bloating in the abdomen. Passage of more gas than usual.  Walking can help get rid of the air that was put into your GI tract during the procedure and reduce the bloating. If you had a lower endoscopy (such as a colonoscopy or flexible sigmoidoscopy) you may notice spotting of blood in your stool or on the toilet paper. If you underwent a bowel prep for your procedure, you may not have a normal bowel movement for a few days.  Please Note:  You might notice some irritation and congestion in your nose or some drainage.  This is from the oxygen used during your procedure.  There is no need for concern and it should clear up in a day or so.  SYMPTOMS TO REPORT IMMEDIATELY:    Following upper endoscopy (EGD)  Vomiting of blood or coffee ground material  New chest pain or pain under the shoulder blades  Painful or persistently difficult swallowing  New shortness of breath  Fever of 100F or higher  Black,  tarry-looking stools  For urgent or emergent issues, a gastroenterologist can be reached at any hour by calling [phone].   DIET:  We do recommend a small meal at first, but then you may proceed to your regular diet.  Drink plenty of fluids but you should avoid alcoholic beverages for 24 hours.  ACTIVITY:  You should plan to take it easy for the rest of today and you should NOT DRIVE or use heavy machinery until tomorrow (because of the sedation medicines used during the test).    FOLLOW UP: Our staff will call the number listed on your records the next business day following your procedure to check on you and address any questions or concerns that you may have regarding the information given to you following your procedure. If we do not reach you, we will leave a message.  However, if you are feeling well and you are not experiencing any problems, there is no need to return our call.  We will assume that you have returned to your regular daily activities without incident.  If any biopsies were taken you will be contacted by phone or by letter within the next 1-3 weeks.  Please call us at [phone] if you have not heard about the biopsies in 3 weeks.    SIGNATURES/CONFIDENTIALITY: You and/or your care partner have signed paperwork which will be entered into your electronic medical record.  These signatures  attest to the fact that that the information above on your After Visit Summary has been reviewed and is understood.  Full responsibility of the confidentiality of this discharge information lies with you and/or your care-partner.

## 2017-10-26 NOTE — Progress Notes (Signed)
Pt states no changes since previsit. Blake Jordan and Admitting

## 2017-10-26 NOTE — Progress Notes (Signed)
Patient was given a prescription in July that he did not fill. Patient wants to know if he needs that Rx. Discussed with Dr. Carlean Purl, decision to wait on prescribing any additional medications at present until all testing complete. Patient and wife verbalize understanding and agreement.
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
## 2017-10-26 NOTE — Progress Notes (Signed)
Spontaneous respirations throughout. VSS. Resting comfortably. To PACU on room air. Report to  RN. 

## 2017-10-26 NOTE — Op Note (Addendum)
[facility] Patient Name: Blake Jordan Procedure Date: 10/26/2017 9:43 AM MRN: [id number] Endoscopist: Gatha Mayer , MD Age: 36 Referring MD:  Date of Birth: 07/18/1981 Gender: Male Account #: [id number] Procedure:                Upper GI endoscopy Indications:              Esophageal reflux Medicines:                Propofol per Anesthesia, Monitored Anesthesia Care Procedure:                Pre-Anesthesia Assessment:                           - Prior to the procedure, a History and Physical                            was performed, and patient medications and                            allergies were reviewed. The patient's tolerance of                            previous anesthesia was also reviewed. The risks                            and benefits of the procedure and the sedation                            options and risks were discussed with the patient.                            All questions were answered, and informed consent                            was obtained. Prior Anticoagulants: The patient has                            taken no previous anticoagulant or antiplatelet                            agents. ASA Grade Assessment: II - A patient with                            mild systemic disease. After reviewing the risks                            and benefits, the patient was deemed in                            satisfactory condition to undergo the procedure.                           After obtaining informed consent, the endoscope was  passed under direct vision. Throughout the                            procedure, the patient's blood pressure, pulse, and                            oxygen saturations were monitored continuously. The                            Endoscope was introduced through the mouth, and                            advanced to the second part of duodenum. The upper                            GI endoscopy  was accomplished without difficulty.                            The patient tolerated the procedure well. Scope In: Scope Out: Findings:                 A medium amount of food (residue) was found in the                            gastric body - greater curve.                           The exam was otherwise without abnormality.                           The cardia and gastric fundus were otherwise normal                            on retroflexion. Complications:            No immediate complications. Estimated Blood Loss:     Estimated blood loss: none. Impression:               - A medium amount of food (residue) in the stomach.                            This limited evaluation of part of body/greater                            curve                           - The examination was otherwise normal.                           - No specimens collected. Recommendation:           - Patient has a contact number available for                            emergencies. The signs and symptoms of potential  delayed complications were discussed with the                            patient. Return to normal activities tomorrow.                            Written discharge instructions were provided to the                            patient.                           - Resume previous diet.                           - Continue present medications.                           - My office will schedule a 4 hour solid-phase                            gastric emotying study. Dx - GERD and food                            retention at EGD                           - He also needs next available f/u appt me.                           Advised him to take MiraLax daily to help with                            defecation problems. Says it will work.                           He says ophtho said he could take Levsin even                            though he has glaucoma - he has not  tried IB [place]                            due to cost.                           Will sort things out more aftyer GES and at [place], MD 10/26/2017 10:01:26 AM This report has been signed electronically.

## 2017-10-27 ENCOUNTER — Encounter (INDEPENDENT_AMBULATORY_CARE_PROVIDER_SITE_OTHER): Payer: Self-pay | Admitting: Orthopaedic Surgery

## 2017-10-27 ENCOUNTER — Telehealth: Payer: Self-pay

## 2017-10-27 ENCOUNTER — Other Ambulatory Visit: Payer: Self-pay

## 2017-10-27 DIAGNOSIS — K219 Gastro-esophageal reflux disease without esophagitis: Secondary | ICD-10-CM

## 2017-10-27 NOTE — Telephone Encounter (Signed)
Number identifier left a message x 2.

## 2017-10-27 NOTE — Telephone Encounter (Signed)
Called [phone] and left a messaged we tried to reach pt for a follow up call. maw

## 2017-10-27 NOTE — Progress Notes (Signed)
Patient notified of GES scheduled for 11/08/17 [facility] 7:30.  He is asked to arrive at 7:15, be NPO after midnight, and hold zofran for 8 hours prior

## 2017-11-05 ENCOUNTER — Telehealth (INDEPENDENT_AMBULATORY_CARE_PROVIDER_SITE_OTHER): Payer: Self-pay | Admitting: Radiology

## 2017-11-05 ENCOUNTER — Encounter: Payer: Self-pay | Admitting: Family Medicine

## 2017-11-05 DIAGNOSIS — M4802 Spinal stenosis, cervical region: Secondary | ICD-10-CM | POA: Diagnosis not present

## 2017-11-05 DIAGNOSIS — M542 Cervicalgia: Secondary | ICD-10-CM | POA: Diagnosis not present

## 2017-11-05 DIAGNOSIS — M79602 Pain in left arm: Secondary | ICD-10-CM | POA: Diagnosis not present

## 2017-11-05 NOTE — Telephone Encounter (Signed)
Blake Jordan called needing demographic and insurance card faxed to her at [phone]. Information faxed.

## 2017-11-08 ENCOUNTER — Ambulatory Visit (HOSPITAL_COMMUNITY)
Admission: RE | Admit: 2017-11-08 | Discharge: 2017-11-08 | Disposition: A | Discharge status: Home / Self Care | Payer: BLUE CROSS/BLUE SHIELD | Source: Ambulatory Visit | Attending: Internal Medicine | Admitting: Internal Medicine

## 2017-11-08 ENCOUNTER — Encounter: Payer: Self-pay | Admitting: Internal Medicine

## 2017-11-08 DIAGNOSIS — K219 Gastro-esophageal reflux disease without esophagitis: Secondary | ICD-10-CM | POA: Diagnosis not present

## 2017-11-08 MED ORDER — TECHNETIUM TC 99M SULFUR COLLOID
2.1000 | Freq: Once | INTRAVENOUS | Status: AC | PRN
  Administered 2017-11-08: 2.1 via INTRAVENOUS

## 2017-11-08 NOTE — Progress Notes (Signed)
Normal gastric emptying Will let him know this is ok by My Chart

## 2017-11-11 ENCOUNTER — Encounter (INDEPENDENT_AMBULATORY_CARE_PROVIDER_SITE_OTHER): Payer: Self-pay | Admitting: Orthopaedic Surgery

## 2017-11-11 ENCOUNTER — Ambulatory Visit (INDEPENDENT_AMBULATORY_CARE_PROVIDER_SITE_OTHER): Payer: BLUE CROSS/BLUE SHIELD | Admitting: Orthopaedic Surgery

## 2017-11-11 VITALS — BP 117/78 | HR 71

## 2017-11-11 DIAGNOSIS — G8929 Other chronic pain: Secondary | ICD-10-CM

## 2017-11-11 DIAGNOSIS — M898X1 Other specified disorders of bone, shoulder: Secondary | ICD-10-CM | POA: Diagnosis not present

## 2017-11-11 NOTE — Progress Notes (Signed)
Office Visit Note   Patient: Blake Jordan           Date of Birth: 1981-06-11           MRN: [id number] Visit Date: 11/11/2017              Requested by: Timmothy Euler, MD [place], [place] [address] PCP: Timmothy Euler, MD   Assessment & Plan: Visit Diagnoses:  1. Chronic scapular pain     Plan: MRI scan of his neck looks good.  May have pulled some periscapular muscles when he is working out after he been active for period of time.  Gradual resumption of work activities going with light weights and then very very slowly increasing..  Lateral thoracic innervated muscle testing is normal.  He can return on a as needed basis.  MRI report of the cervical spine.  Follow-Up Instructions: Return if symptoms worsen or fail to improve.   Orders:  No orders of the defined types were placed in this encounter.  No orders of the defined types were placed in this encounter.     Procedures: No procedures performed   Clinical Data: No additional findings.   Subjective: Chief Complaint  Patient presents with  . Neck - Pain, Follow-up    HPI 36 year old male returns with ongoing problems with neck pain and pain underneath the scapula worse in the left than right.  Sit down poles and significant resistance when he had some increased pain.  He is also been working on a transmission put again in a car 11/22-11/24 and had increased pain.  Cardiac workup previously done he has been through physical therapy prednisone pack anti-inflammatories muscle relaxants without relief.  Cervical MRI scan was obtained to make sure he does not have cervical disc protrusion causing his neck ,shoulder  And upper chest pain.  He has had a GI workup as well which we have reviewed.  Review of Systems of systems updated 14 point unchanged from 09/16/2017   Objective: Vital Signs: BP 117/78   Pulse 71   Physical Exam  Constitutional: He is oriented to person, place, and time. He  appears well-developed and well-nourished.  HENT:  Head: Normocephalic and atraumatic.  Eyes: EOM are normal. Pupils are equal, round, and reactive to light.  Neck: No tracheal deviation present. No thyromegaly present.  Cardiovascular: Normal rate.  Pulmonary/Chest: Effort normal. He has no wheezes.  Abdominal: Soft. Bowel sounds are normal.  Neurological: He is alert and oriented to person, place, and time.  Skin: Skin is warm and dry. Capillary refill takes less than 2 seconds.  Psychiatric: He has a normal mood and affect. His behavior is normal. Judgment and thought content normal.    Ortho Exam normal flexion-extension of the cervical spine.  He still has some brachial plexus tenderness on the right mild positive Spurling on the right negative on the left.  Upper extremity reflexes are 2+ and symmetrical.  No isolated motor weakness biceps triceps rotator cuff drop arm test negative impingement right and left.  Specialty Comments:  No specialty comments available.  Imaging: Cervical MRI scan done 11/05/2017 shows mild right foraminal narrowing at C3-4 and at C 4-5.  Otherwise no compression.    PMFS History: Patient Active Problem List   Diagnosis Date Noted  . Anxiety 08/17/2017   Past Medical History:  Diagnosis Date  . Fatty liver   . GERD (gastroesophageal reflux disease)   . Glaucoma    potentially  will have - needs eye drops  . History of kidney stones   . Hypercholesterolemia     Family History  Problem Relation Age of Onset  . Pancreatic cancer Mother   . Heart disease Father   . COPD Father   . Hypertension Father   . Heart disease Brother   . Diabetes Maternal Grandmother   . Cirrhosis Maternal Aunt   . Colon cancer Neg Hx   . Esophageal cancer Neg Hx   . Stomach cancer Neg Hx     Past Surgical History:  Procedure Laterality Date  . CARDIOVASCULAR STRESS TEST    . DENTAL RESTORATION/EXTRACTION WITH X-RAY    . None     Social History    Occupational History  . Occupation: Heavy Company secretary  Tobacco Use  . Smoking status: Former Smoker    Last attempt to quit: 11/23/2013    Years since quitting: 3.9  . Smokeless tobacco: Former Systems developer    Quit date: 11/24/2015  Substance and Sexual Activity  . Alcohol use: No  . Drug use: No  . Sexual activity: Not on file

## 2017-11-12 ENCOUNTER — Encounter (INDEPENDENT_AMBULATORY_CARE_PROVIDER_SITE_OTHER): Payer: Self-pay | Admitting: Orthopaedic Surgery

## 2017-12-15 ENCOUNTER — Ambulatory Visit: Payer: BLUE CROSS/BLUE SHIELD | Admitting: Family Medicine

## 2017-12-15 ENCOUNTER — Encounter: Payer: Self-pay | Admitting: Family Medicine

## 2017-12-15 DIAGNOSIS — E785 Hyperlipidemia, unspecified: Secondary | ICD-10-CM | POA: Diagnosis not present

## 2017-12-15 DIAGNOSIS — F419 Anxiety disorder, unspecified: Secondary | ICD-10-CM | POA: Diagnosis not present

## 2017-12-15 DIAGNOSIS — R7309 Other abnormal glucose: Secondary | ICD-10-CM | POA: Diagnosis not present

## 2017-12-15 DIAGNOSIS — L719 Rosacea, unspecified: Secondary | ICD-10-CM

## 2017-12-15 DIAGNOSIS — Z23 Encounter for immunization: Secondary | ICD-10-CM

## 2017-12-15 LAB — CMP14+EGFR
ALT: 71 IU/L — ABNORMAL HIGH (ref 0–44)
AST: 39 IU/L (ref 0–40)
Albumin/Globulin Ratio: 2.1 (ref 1.2–2.2)
Albumin: 4.9 g/dL (ref 3.5–5.5)
Alkaline Phosphatase: 50 IU/L (ref 39–117)
BUN/Creatinine Ratio: 17 (ref 9–20)
BUN: 17 mg/dL (ref 6–20)
Bilirubin Total: 0.2 mg/dL (ref 0.0–1.2)
CO2: 25 mmol/L (ref 20–29)
Calcium: 9.6 mg/dL (ref 8.7–10.2)
Chloride: 100 mmol/L (ref 96–106)
Creatinine, Ser: 0.99 mg/dL (ref 0.76–1.27)
GFR calc Af Amer: 114 mL/min/{1.73_m2} (ref >59)
GFR calc non Af Amer: 99 mL/min/{1.73_m2} (ref >59)
Globulin, Total: 2.3 g/dL (ref 1.5–4.5)
Glucose: 111 mg/dL — ABNORMAL HIGH (ref 65–99)
Potassium: 5 mmol/L (ref 3.5–5.2)
Sodium: 140 mmol/L (ref 134–144)
Total Protein: 7.2 g/dL (ref 6.0–8.5)

## 2017-12-15 LAB — LIPID PANEL
Chol/HDL Ratio: 6.4 ratio — ABNORMAL HIGH (ref 0.0–5.0)
Cholesterol, Total: 244 mg/dL — ABNORMAL HIGH (ref 100–199)
HDL: 38 mg/dL — ABNORMAL LOW (ref >39)
LDL Calculated: 180 mg/dL — ABNORMAL HIGH (ref 0–99)
Triglycerides: 130 mg/dL (ref 0–149)
VLDL Cholesterol Cal: 26 mg/dL (ref 5–40)

## 2017-12-15 MED ORDER — METRONIDAZOLE 0.75 % EX CREA: TOPICAL_CREAM | Freq: Two times a day (BID) | CUTANEOUS | 0 refills | Status: DC

## 2017-12-15 MED ORDER — ESCITALOPRAM OXALATE 10 MG PO TABS: 10.0000 mg | ORAL_TABLET | Freq: Every day | ORAL | 1 refills | Status: DC

## 2017-12-15 NOTE — Progress Notes (Signed)
HPI  Patient presents today here to follow-up for anxiety.  Patient states that he is feeling better overall with the Lexapro.  He states that he is worrying less and that he is having less chest pain. He is tolerating the medication well and notes a little bit of sleepiness sometimes. He states that he has been overeating but is also worried about his weight.  He is also worried about his previous elevation of LFTs and would like repeat labs.   Also patient has had rash on the face over 6 months, not responding lotion  PMH: Smoking status noted ROS: Per HPI  Objective: There were no vitals taken for this visit. Gen: NAD, alert, cooperative with exam HEENT: NCAT CV: RRR, good S1/S2, no murmur Resp: CTABL, no wheezes, non-labored Abd: SNTND, BS present, no guarding or organomegaly Ext: No edema, warm Neuro: Alert and oriented, No gross deficits Skin:  Bilateral cheeks with mild erythematous papules  Assessment and plan:  #Anxiety Doing well with Lexapro Continue, monitor weight gain  #Hyperlipidemia Discussed with patient, repeat fasting labs today Likely slightly worsening with weight gain  #Rosacea New diagnosis, trial of metronidazole gel Follow-up 4 months    Orders Placed This Encounter  Procedures  . CMP14+EGFR  . Lipid panel    Meds ordered this encounter  Medications  . escitalopram (LEXAPRO) 10 MG tablet    Sig: Take 1 tablet (10 mg total) by mouth daily.    Dispense:  90 tablet    Refill:  1  . metroNIDAZOLE (METROCREAM) 0.75 % cream    Sig: Apply topically 2 (two) times daily.    Dispense:  45 g    Refill:  0    Sam Bradshaw, MD [facility] 12/15/2017, 8:58 AM     

## 2017-12-15 NOTE — Patient Instructions (Signed)
Great to see you!  We will let you know within 1 week about your labs.

## 2017-12-15 NOTE — Addendum Note (Signed)
Addended by: Nigel Berthold C on: 12/15/2017 09:30 AM   Modules accepted: Orders

## 2017-12-16 NOTE — Addendum Note (Signed)
Addended by: Nigel Berthold C on: 12/16/2017 01:50 PM   Modules accepted: Orders

## 2017-12-17 LAB — BAYER DCA HB A1C WAIVED: HB A1C (BAYER DCA - WAIVED): 5.3 % (ref <7.0)

## 2018-01-07 ENCOUNTER — Telehealth: Payer: Self-pay | Admitting: Family Medicine

## 2018-01-07 NOTE — Telephone Encounter (Signed)
Agree with nursing documentation.   Laroy Apple, MD [facility] 01/07/2018, 1:37 PM

## 2018-01-07 NOTE — Telephone Encounter (Signed)
Patient felt nauseated this morning and took a zofran. He wanted to make sure this was ok? Patient advised that it was ok to have taken with the lexapro.

## 2018-03-09 ENCOUNTER — Encounter: Payer: Self-pay | Admitting: Family Medicine

## 2018-03-09 DIAGNOSIS — R7989 Other specified abnormal findings of blood chemistry: Secondary | ICD-10-CM

## 2018-03-09 DIAGNOSIS — R945 Abnormal results of liver function studies: Principal | ICD-10-CM

## 2018-03-21 DIAGNOSIS — H401131 Primary open-angle glaucoma, bilateral, mild stage: Secondary | ICD-10-CM | POA: Diagnosis not present

## 2018-03-24 ENCOUNTER — Ambulatory Visit: Payer: BLUE CROSS/BLUE SHIELD | Admitting: Family Medicine

## 2018-03-24 ENCOUNTER — Encounter: Payer: Self-pay | Admitting: Family Medicine

## 2018-03-24 VITALS — BP 137/90 | HR 83 | Temp 96.9°F | Ht 71.0 in | Wt 221.8 lb

## 2018-03-24 DIAGNOSIS — R7989 Other specified abnormal findings of blood chemistry: Secondary | ICD-10-CM

## 2018-03-24 DIAGNOSIS — R1011 Right upper quadrant pain: Secondary | ICD-10-CM | POA: Diagnosis not present

## 2018-03-24 DIAGNOSIS — F419 Anxiety disorder, unspecified: Secondary | ICD-10-CM

## 2018-03-24 DIAGNOSIS — L719 Rosacea, unspecified: Secondary | ICD-10-CM | POA: Diagnosis not present

## 2018-03-24 DIAGNOSIS — R945 Abnormal results of liver function studies: Secondary | ICD-10-CM | POA: Diagnosis not present

## 2018-03-24 NOTE — Progress Notes (Signed)
   HPI  Patient presents today with flank pain.  Patient explains that he has had right upper quadrant abdominal pain for the last month or so.'s been coming and going.  At times it comes on and stays for weeks.  Is described as right sided sharp upper quadrant pain with no aggravating or alleviating factors.  He has noticed that when he stopped ice cream it went away, however he does have many dairy products on a daily basis besides ice cream.  He is concerned he may have a lactose intolerance, or something more difficult to discern light gluten sensitive enteropathy. He is interested in seeing an allergist.  He is also interested in colonoscopy given that he at times does have left upper quadrant pain and feels this may be related to his colon.  He is tolerating food and fluids like usual. He is doing well with Lexapro, however he misses about 1 in 4 pills.   PMH: Smoking status noted ROS: Per HPI  Objective: BP 137/90   Pulse 83   Temp (!) 96.9 F (36.1 C) (Oral)   Ht 5\' 11"  (1.803 m)   Wt 221 lb 12.8 oz (100.6 kg)   BMI 30.93 kg/m  Gen: NAD, alert, cooperative with exam HEENT: NCAT CV: RRR, good S1/S2, no murmur Resp: CTABL, no wheezes, non-labored Abd: mild TTP throughout, no guarding, +BS Ext: No edema, warm Neuro: Alert and oriented, No gross deficits  Assessment and plan:  #Right upper quadrant pain.  Patient with unusual right upper quadrant pain, he is concerned about food allergy. Refer to allergy and immunology. He is also concerned that he may have a problem in the colon, I recommended with his continued symptoms that he can follow-up with GI, appreciate the recommendations and evaluation. Labs this morning.  Rosacea Improving with metronidazole gel, he did have scaling so he stopped the gel, discussed using 2-3 times a week  #Anxiety Improved on Lexapro, he has missing plenty of pills, he is nervous about leaving it out because of his children. I  recommended using a pillbox which would limit the number of pills that are out     Orders Placed This Encounter  Procedures  . Ambulatory referral to Allergy    Referral Priority:   Routine    Referral Type:   Allergy Testing    Referral Reason:   Specialty Services Required    Requested Specialty:   Allergy    Number of Visits Requested:   [place], MD [facility] 03/24/2018, 8:25 AM

## 2018-03-24 NOTE — Addendum Note (Signed)
Addended by: Karle Plumber on: 03/24/2018 09:26 AM   Modules accepted: Orders

## 2018-03-25 LAB — BMP8+EGFR
BUN/Creatinine Ratio: 13 (ref 9–20)
BUN: 13 mg/dL (ref 6–20)
CO2: 23 mmol/L (ref 20–29)
Calcium: 9.7 mg/dL (ref 8.7–10.2)
Chloride: 106 mmol/L (ref 96–106)
Creatinine, Ser: 1.02 mg/dL (ref 0.76–1.27)
GFR calc Af Amer: 110 mL/min/{1.73_m2} (ref >59)
GFR calc non Af Amer: 95 mL/min/{1.73_m2} (ref >59)
Glucose: 110 mg/dL — ABNORMAL HIGH (ref 65–99)
Potassium: 4.4 mmol/L (ref 3.5–5.2)
Sodium: 144 mmol/L (ref 134–144)

## 2018-03-25 LAB — CBC WITH DIFFERENTIAL/PLATELET
Basophils Absolute: 0 10*3/uL (ref 0.0–0.2)
Basos: 0 %
EOS (ABSOLUTE): 0.2 10*3/uL (ref 0.0–0.4)
Eos: 4 %
Hematocrit: 45.4 % (ref 37.5–51.0)
Hemoglobin: 15.5 g/dL (ref 13.0–17.7)
Immature Grans (Abs): 0 10*3/uL (ref 0.0–0.1)
Immature Granulocytes: 0 %
Lymphocytes Absolute: 2 10*3/uL (ref 0.7–3.1)
Lymphs: 40 %
MCH: 29.5 pg (ref 26.6–33.0)
MCHC: 34.1 g/dL (ref 31.5–35.7)
MCV: 86 fL (ref 79–97)
Monocytes Absolute: 0.5 10*3/uL (ref 0.1–0.9)
Monocytes: 9 %
Neutrophils Absolute: 2.4 10*3/uL (ref 1.4–7.0)
Neutrophils: 47 %
Platelets: 256 10*3/uL (ref 150–379)
RBC: 5.26 x10E6/uL (ref 4.14–5.80)
RDW: 13.3 % (ref 12.3–15.4)
WBC: 5.1 10*3/uL (ref 3.4–10.8)

## 2018-03-25 LAB — HEPATIC FUNCTION PANEL
ALT: 118 IU/L — ABNORMAL HIGH (ref 0–44)
AST: 57 IU/L — ABNORMAL HIGH (ref 0–40)
Albumin: 4.3 g/dL (ref 3.5–5.5)
Alkaline Phosphatase: 41 IU/L (ref 39–117)
Bilirubin Total: 0.4 mg/dL (ref 0.0–1.2)
Bilirubin, Direct: 0.13 mg/dL (ref 0.00–0.40)
Total Protein: 6.9 g/dL (ref 6.0–8.5)

## 2018-03-26 NOTE — Progress Notes (Signed)
We will contact the patient and arrange an appointment for abnormal transaminases I think fatty liver is likely issue He should do ANA, ferritin, hep C Ab, Hep B surface Ag, Hep B core Ab total, Hep B surface Ab as a basic lab work-up which he can do before he comes to me - Can you have him do those at your office so he does not have to drive to [place] (unless he is ok with that)

## 2018-03-28 ENCOUNTER — Other Ambulatory Visit: Payer: Self-pay | Admitting: Family Medicine

## 2018-03-28 DIAGNOSIS — R7989 Other specified abnormal findings of blood chemistry: Secondary | ICD-10-CM

## 2018-03-28 DIAGNOSIS — R945 Abnormal results of liver function studies: Principal | ICD-10-CM

## 2018-03-28 NOTE — Progress Notes (Signed)
Basic work-up for elevated LFTs per GI.  Will ask nursing to ask patient to come in for labs prior to seeing his GI doctor.  Laroy Apple, MD [facility] 03/28/2018, 5:13 PM

## 2018-03-29 NOTE — Progress Notes (Signed)
Patient aware and verbalizes understanding. 

## 2018-04-04 DIAGNOSIS — H401111 Primary open-angle glaucoma, right eye, mild stage: Secondary | ICD-10-CM | POA: Diagnosis not present

## 2018-04-13 ENCOUNTER — Encounter: Payer: Self-pay | Admitting: Physician Assistant

## 2018-04-13 ENCOUNTER — Ambulatory Visit: Payer: BLUE CROSS/BLUE SHIELD | Admitting: Physician Assistant

## 2018-04-13 VITALS — BP 134/80 | HR 102 | Temp 97.7°F | Ht 71.0 in | Wt 210.4 lb

## 2018-04-13 DIAGNOSIS — H6502 Acute serous otitis media, left ear: Secondary | ICD-10-CM

## 2018-04-13 MED ORDER — AMOXICILLIN 875 MG PO TABS
875.0000 mg | ORAL_TABLET | Freq: Two times a day (BID) | ORAL | 0 refills | Status: DC
Start: 2018-04-13 — End: 2018-06-01

## 2018-04-13 NOTE — Patient Instructions (Signed)
Otitis Media, Adult Otitis media is redness, soreness, and puffiness (swelling) in the space just behind your eardrum (middle ear). It may be caused by allergies or infection. It often happens along with a cold. Follow these instructions at home:  Take your medicine as told. Finish it even if you start to feel better.  Only take over-the-counter or prescription medicines for pain, discomfort, or fever as told by your doctor.  Follow up with your doctor as told. Contact a doctor if:  You have otitis media only in one ear, or bleeding from your nose, or both.  You notice a lump on your neck.  You are not getting better in 3-5 days.  You feel worse instead of better. Get help right away if:  You have pain that is not helped with medicine.  You have puffiness, redness, or pain around your ear.  You get a stiff neck.  You cannot move part of your face (paralysis).  You notice that the bone behind your ear hurts when you touch it. This information is not intended to replace advice given to you by your health care provider. Make sure you discuss any questions you have with your health care provider. Document Released: 04/27/2008 Document Revised: 04/16/2016 Document Reviewed: 06/06/2013 Elsevier Interactive Patient Education  2017 Elsevier Inc.  

## 2018-04-13 NOTE — Progress Notes (Signed)
  Subjective:     Patient ID: Blake Jordan, male   DOB: Nov 07, 1981, 37 y.o.   MRN: [id number]  HPI Pt with L ear and facial pain since Sat No meds for sx  Review of Systems  Constitutional: Positive for activity change, fatigue and fever. Negative for appetite change.  HENT: Positive for congestion, ear pain, postnasal drip and sinus pressure. Negative for ear discharge, nosebleeds, rhinorrhea, sinus pain, sore throat and voice change.   Eyes: Negative.   Respiratory: Negative.   Cardiovascular: Negative.        Objective:   Physical Exam  Constitutional: He appears well-developed and well-nourished.  HENT:  Right Ear: External ear normal.  Left Ear: External ear normal.  Mouth/Throat: Oropharynx is clear and moist. No oropharyngeal exudate.  L canal nl L TM erythem and bulging R canal and TM nl No sinus TTP  Cardiovascular: Normal rate, regular rhythm and normal heart sounds.  Pulmonary/Chest: Effort normal and breath sounds normal.  Nursing note and vitals reviewed.      Assessment:     1. Acute serous otitis media of left ear, recurrence not specified        Plan:     Fluids OTC meds for sx Amox 875 mg bid x 10 days Work note F/U prn

## 2018-04-27 ENCOUNTER — Telehealth: Payer: Self-pay | Admitting: Family Medicine

## 2018-04-27 NOTE — Telephone Encounter (Signed)
Since this occurred after applying straw, I believe this is related to allergies. Start daily zyrtec and flonase.

## 2018-04-27 NOTE — Telephone Encounter (Signed)
Patient aware of recommendation and will pick up medicines.

## 2018-05-12 ENCOUNTER — Encounter: Payer: Self-pay | Admitting: Family Medicine

## 2018-05-12 DIAGNOSIS — H401131 Primary open-angle glaucoma, bilateral, mild stage: Secondary | ICD-10-CM | POA: Diagnosis not present

## 2018-05-14 ENCOUNTER — Other Ambulatory Visit: Payer: BLUE CROSS/BLUE SHIELD

## 2018-05-14 DIAGNOSIS — R945 Abnormal results of liver function studies: Secondary | ICD-10-CM | POA: Diagnosis not present

## 2018-05-14 DIAGNOSIS — R7989 Other specified abnormal findings of blood chemistry: Secondary | ICD-10-CM

## 2018-05-16 LAB — HEPATITIS B SURFACE ANTIBODY,QUALITATIVE: Hep B Surface Ab, Qual: NONREACTIVE

## 2018-05-16 LAB — FERRITIN: Ferritin: 309 ng/mL (ref 30–400)

## 2018-05-16 LAB — HEPATITIS C ANTIBODY: Hep C Virus Ab: <0.1 s/co ratio (ref 0.0–0.9)

## 2018-05-16 LAB — HEPATITIS B CORE AB W/REFLEX: Hep B Core Total Ab: NEGATIVE

## 2018-05-16 LAB — HEPATITIS B SURFACE ANTIGEN: Hepatitis B Surface Ag: NEGATIVE

## 2018-05-16 LAB — ANA: Anti Nuclear Antibody(ANA): NEGATIVE

## 2018-05-23 ENCOUNTER — Ambulatory Visit: Payer: BLUE CROSS/BLUE SHIELD | Admitting: Internal Medicine

## 2018-06-01 ENCOUNTER — Other Ambulatory Visit (INDEPENDENT_AMBULATORY_CARE_PROVIDER_SITE_OTHER): Payer: BLUE CROSS/BLUE SHIELD

## 2018-06-01 ENCOUNTER — Encounter: Payer: Self-pay | Admitting: Internal Medicine

## 2018-06-01 ENCOUNTER — Ambulatory Visit (INDEPENDENT_AMBULATORY_CARE_PROVIDER_SITE_OTHER): Payer: BLUE CROSS/BLUE SHIELD | Admitting: Internal Medicine

## 2018-06-01 VITALS — BP 122/80 | HR 72 | Ht 71.5 in | Wt 201.2 lb

## 2018-06-01 DIAGNOSIS — R748 Abnormal levels of other serum enzymes: Secondary | ICD-10-CM | POA: Diagnosis not present

## 2018-06-01 DIAGNOSIS — R109 Unspecified abdominal pain: Secondary | ICD-10-CM

## 2018-06-01 DIAGNOSIS — R300 Dysuria: Secondary | ICD-10-CM

## 2018-06-01 DIAGNOSIS — R1011 Right upper quadrant pain: Secondary | ICD-10-CM

## 2018-06-01 DIAGNOSIS — N200 Calculus of kidney: Secondary | ICD-10-CM

## 2018-06-01 LAB — URINALYSIS, ROUTINE W REFLEX MICROSCOPIC
Hgb urine dipstick: NEGATIVE
Ketones, ur: NEGATIVE
Leukocytes, UA: NEGATIVE
Nitrite: NEGATIVE
RBC / HPF: NONE SEEN (ref 0–?)
Specific Gravity, Urine: >=1.03 — AB (ref 1.000–1.030)
Total Protein, Urine: NEGATIVE
Urine Glucose: NEGATIVE
Urobilinogen, UA: 0.2 (ref 0.0–1.0)
pH: 5.5 (ref 5.0–8.0)

## 2018-06-01 LAB — HEPATIC FUNCTION PANEL
ALT: 55 U/L — ABNORMAL HIGH (ref 0–53)
AST: 27 U/L (ref 0–37)
Albumin: 4.8 g/dL (ref 3.5–5.2)
Alkaline Phosphatase: 47 U/L (ref 39–117)
Bilirubin, Direct: 0.1 mg/dL (ref 0.0–0.3)
Total Bilirubin: 0.6 mg/dL (ref 0.2–1.2)
Total Protein: 7.5 g/dL (ref 6.0–8.3)

## 2018-06-01 NOTE — Patient Instructions (Addendum)
  Your provider has requested that you go to the basement level for lab work before leaving today. Press "B" on the elevator. The lab is located at the first door on the left as you exit the elevator.   Congratulations on your weight loss. Keep up the good work.   We have made you an appointment with [facility] at [address] [place] [address], phone # [phone] with Dr Nicolette Bang for 06/28/18 at 10:00AM. They will send you an information packet in the mail.    I appreciate the opportunity to care for you. Silvano Rusk, MD, [facility]

## 2018-06-01 NOTE — Progress Notes (Signed)
   Blake Jordan 37 y.o. Apr 17, 1981 [id number]  Assessment & Plan:   Encounter Diagnoses  Name Primary?  . Abnormal transaminases Yes  . Dysuria   . RUQ pain   . Right flank pain   . Kidney stones    I think his abnormal transaminases have come from fatty liver and he has done a great job on weight loss which is the primary treatment. Today I will: Recheck LFT's Ceruloplasmin will be checked for completeness UA because of dysuria and history of kidney stones GU referral because of history of kidney stones Follow-up me pending lab results  Lab Results  Component Value Date   ALT 55 (H) 06/01/2018   AST 27 06/01/2018   ALKPHOS 47 06/01/2018   BILITOT 0.6 06/01/2018    LFTs have returned and are much improved. His UA was negative for signs of kidney stone and had some mucus and small bilirubin and was concentrated Ceruloplasmin was normal We will contact the patient and recommend follow-up labs in 3 months checking LFTs I appreciate the opportunity to care for this patient. CC: Timmothy Euler, MD I will also send a copy to Dr. Nicolette Bang [facility] Subjective:   Chief Complaint: Abnormal transaminases  HPI The patient is here for follow-up evaluation of abnormal transaminases.  I had corresponded with his primary care provider about this in the interim because his hepatic function panel showed abnormal transaminases, see the EMR but in May ALT was 118 AST 57 Prior ultrasound for abdominal pain in June 2018 demonstrated increased echogenicity of the liver consistent with fatty infiltration lab testing recommended by me while waiting for the visit included a negative hepatitis B and C testing normal ferritin and negative ANA  He went ahead andLost 20 # by eliminating sweets Has some R flank soreness and dysuria Intermittent RUQ pains  No EtOH   No Known Allergies Current Meds  Medication Sig  . polyethylene glycol (MIRALAX / GLYCOLAX) packet Take  17 g by mouth daily as needed. hasnt used in a week    Past Medical History:  Diagnosis Date  . Fatty liver   . GERD (gastroesophageal reflux disease)   . Glaucoma    potentially will have - needs eye drops  . History of kidney stones   . Hypercholesterolemia    Past Surgical History:  Procedure Laterality Date  . CARDIOVASCULAR STRESS TEST    . 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   Review of Systems  As above Objective:   Physical Exam BP 122/80   Pulse 72   Ht 5' 11.5" (1.816 m)   Wt 201 lb 4 oz (91.3 kg)   BMI 27.68 kg/m  Eyes anicteric Back no CVAT Lungs cta Cor NL abd soft NT , neg Carnett's No HSM, mass  Data reviewed see HPI

## 2018-06-01 NOTE — Progress Notes (Deleted)
   Blake Jordan 37 y.o. October 09, 1981 [id number]  Assessment & Plan:      Subjective:   Chief Complaint:  HPI   Wt Readings from Last 3 Encounters:  06/01/18 201 lb 4 oz (91.3 kg)  04/13/18 210 lb 6 oz (95.4 kg)  03/24/18 221 lb 12.8 oz (100.6 kg)    No Known Allergies Current Meds  Medication Sig  . polyethylene glycol (MIRALAX / GLYCOLAX) packet Take 17 g by mouth daily as needed. hasnt used in a week    Past Medical History:  Diagnosis Date  . Fatty liver   . GERD (gastroesophageal reflux disease)   . Glaucoma    potentially will have - needs eye drops  . History of kidney stones   . Hypercholesterolemia    Past Surgical History:  Procedure Laterality Date  . CARDIOVASCULAR STRESS TEST    . DENTAL RESTORATION/EXTRACTION WITH X-RAY    . 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   Review of Systems   Objective:   Physical Exam

## 2018-06-02 NOTE — Progress Notes (Signed)
LFT's almost NL UA neg Awaiting ceruloplasmin My Chart

## 2018-06-03 LAB — CERULOPLASMIN: Ceruloplasmin: 28 mg/dL (ref 18–36)

## 2018-06-06 ENCOUNTER — Encounter: Payer: Self-pay | Admitting: Internal Medicine

## 2018-06-06 ENCOUNTER — Other Ambulatory Visit: Payer: Self-pay | Admitting: Internal Medicine

## 2018-06-06 DIAGNOSIS — K760 Fatty (change of) liver, not elsewhere classified: Secondary | ICD-10-CM | POA: Insufficient documentation

## 2018-06-06 NOTE — Progress Notes (Signed)
Labs show significant improvement in liver tests Nearly normal Ceruloplasmin neg My Chart note to patient Will have him recheck LFT 3 mos

## 2018-06-28 DIAGNOSIS — K760 Fatty (change of) liver, not elsewhere classified: Secondary | ICD-10-CM | POA: Diagnosis not present

## 2018-06-28 DIAGNOSIS — F524 Premature ejaculation: Secondary | ICD-10-CM | POA: Diagnosis not present

## 2018-06-28 DIAGNOSIS — N200 Calculus of kidney: Secondary | ICD-10-CM | POA: Diagnosis not present

## 2018-06-30 DIAGNOSIS — H16001 Unspecified corneal ulcer, right eye: Secondary | ICD-10-CM | POA: Diagnosis not present

## 2018-07-04 DIAGNOSIS — H16001 Unspecified corneal ulcer, right eye: Secondary | ICD-10-CM | POA: Diagnosis not present

## 2018-07-11 DIAGNOSIS — H16001 Unspecified corneal ulcer, right eye: Secondary | ICD-10-CM | POA: Diagnosis not present

## 2018-09-02 ENCOUNTER — Other Ambulatory Visit (INDEPENDENT_AMBULATORY_CARE_PROVIDER_SITE_OTHER): Payer: BLUE CROSS/BLUE SHIELD

## 2018-09-02 DIAGNOSIS — K760 Fatty (change of) liver, not elsewhere classified: Secondary | ICD-10-CM | POA: Diagnosis not present

## 2018-09-02 LAB — HEPATIC FUNCTION PANEL
ALT: 30 U/L (ref 0–53)
AST: 18 U/L (ref 0–37)
Albumin: 4.8 g/dL (ref 3.5–5.2)
Alkaline Phosphatase: 46 U/L (ref 39–117)
Bilirubin, Direct: 0.1 mg/dL (ref 0.0–0.3)
Total Bilirubin: 0.5 mg/dL (ref 0.2–1.2)
Total Protein: 7.4 g/dL (ref 6.0–8.3)

## 2018-09-07 ENCOUNTER — Other Ambulatory Visit: Payer: Self-pay | Admitting: Internal Medicine

## 2018-09-07 DIAGNOSIS — R748 Abnormal levels of other serum enzymes: Secondary | ICD-10-CM

## 2018-09-07 NOTE — Progress Notes (Signed)
LFT's Normal  My Chart message  Will recheck in 3 monhs

## 2018-09-26 ENCOUNTER — Encounter: Payer: Self-pay | Admitting: Family Medicine

## 2018-09-26 ENCOUNTER — Ambulatory Visit: Payer: BLUE CROSS/BLUE SHIELD | Admitting: Family Medicine

## 2018-09-26 VITALS — BP 138/87 | HR 74 | Temp 97.3°F | Ht 71.5 in | Wt 200.2 lb

## 2018-09-26 DIAGNOSIS — M542 Cervicalgia: Secondary | ICD-10-CM

## 2018-09-26 MED ORDER — PREDNISONE 20 MG PO TABS: ORAL_TABLET | ORAL | 0 refills | Status: DC

## 2018-09-26 NOTE — Progress Notes (Signed)
BP 138/87   Pulse 74   Temp (!) 97.3 F (36.3 C) (Oral)   Ht 5' 11.5" (1.816 m)   Wt 200 lb 3.2 oz (90.8 kg)   BMI 27.53 kg/m    Subjective:    Patient ID: Blake Jordan, male    DOB: 01-19-1981, 37 y.o.   MRN: [id number]  HPI: Blake Jordan is a 37 y.o. male presenting on 09/26/2018 for Shoulder Pain (Left. Patient states it left side of face and neck down to fingers. Patient went to Dr. Lorin Jordan 10/2017 and states they didn't figure out what was going on.) and Referral (Patient states he would like a referral to see Allergist. States that when he drinks milk he gets consitipation - has been ongoing.)   HPI Left shoulder and neck pain and left-sided face pain Patient is coming in complaining of left shoulder neck pain back pain.  He says the pain is described as a burning sensation that goes down from the left side of his neck into his left shoulder and numbness goes into his left arm and has some pain and tingling sensation left side of his face.  He denies any numbness or weakness in that arm but this is more concerned because this pain has progressed.  He says he has an MRI that was done through Dr. Inda Jordan in December 2018 at [facility] rocking him.  We cannot see that MRI at this point we are requesting this record.  He says that the pain has progressed to where the left-sided facial pain is new.  Relevant past medical, surgical, family and social history reviewed and updated as indicated. Interim medical history since our last visit reviewed. Allergies and medications reviewed and updated.  Review of Systems  Constitutional: Negative for chills and fever.  Respiratory: Negative for shortness of breath and wheezing.   Cardiovascular: Negative for chest pain and leg swelling.  Musculoskeletal: Positive for arthralgias and neck pain. Negative for back pain and gait problem.  Skin: Negative for rash.  Neurological: Positive for numbness. Negative for dizziness, weakness and headaches.    All other systems reviewed and are negative.   Per HPI unless specifically indicated above   Allergies as of 09/26/2018   No Known Allergies     Medication List        Accurate as of 09/26/18  4:29 PM. Always use your most recent med list.          ibuprofen 600 MG tablet Commonly known as:  ADVIL,MOTRIN Take 1 tablet (600 mg total) by mouth every 6 (six) hours as needed.   latanoprost 0.005 % ophthalmic solution Commonly known as:  XALATAN 1 drop at bedtime.   metroNIDAZOLE 0.75 % cream Commonly known as:  METROCREAM Apply topically 2 (two) times daily.   PARoxetine 10 MG tablet Commonly known as:  PAXIL Take 10 mg by mouth daily.   polyethylene glycol packet Commonly known as:  MIRALAX / GLYCOLAX Take 17 g by mouth daily as needed. hasnt used in a week          Objective:    BP 138/87   Pulse 74   Temp (!) 97.3 F (36.3 C) (Oral)   Ht 5' 11.5" (1.816 m)   Wt 200 lb 3.2 oz (90.8 kg)   BMI 27.53 kg/m   Wt Readings from Last 3 Encounters:  09/26/18 200 lb 3.2 oz (90.8 kg)  06/01/18 201 lb 4 oz (91.3 kg)  04/13/18 210 lb 6 oz (95.4 kg)  Physical Exam  Constitutional: He is oriented to person, place, and time. He appears well-developed and well-nourished. No distress.  Eyes: Conjunctivae are normal. No scleral icterus.  Neck: Neck supple.  Cardiovascular: Normal rate, regular rhythm, normal heart sounds and intact distal pulses.  No murmur heard. Pulmonary/Chest: Effort normal and breath sounds normal. No respiratory distress. He has no wheezes.  Musculoskeletal: Normal range of motion. He exhibits no edema.       Cervical back: He exhibits tenderness.       Back:  Lymphadenopathy:    He has no cervical adenopathy.  Neurological: He is alert and oriented to person, place, and time. Coordination normal.  Skin: Skin is warm and dry. No rash noted. He is not diaphoretic.  Psychiatric: He has a normal mood and affect. His behavior is normal.   Nursing note and vitals reviewed.      Assessment & Plan:   Problem List Items Addressed This Visit    None    Visit Diagnoses    Cervicalgia    -  Primary   Relevant Medications   predniSONE (DELTASONE) 20 MG tablet      Awaiting MRI, had to call over to [facility] him to try and get the scan. Follow up plan: Return if symptoms worsen or fail to improve.  Counseling provided for all of the vaccine components No orders of the defined types were placed in this encounter.   Caryl Pina, MD [facility] 09/26/2018, 4:29 PM

## 2018-10-03 ENCOUNTER — Encounter: Payer: Self-pay | Admitting: Family Medicine

## 2018-10-10 ENCOUNTER — Telehealth: Payer: Self-pay | Admitting: Family Medicine

## 2018-10-10 NOTE — Telephone Encounter (Signed)
Please advise 

## 2018-11-04 ENCOUNTER — Telehealth: Payer: Self-pay | Admitting: *Deleted

## 2018-11-04 ENCOUNTER — Other Ambulatory Visit: Payer: Self-pay | Admitting: Family Medicine

## 2018-11-04 DIAGNOSIS — M542 Cervicalgia: Secondary | ICD-10-CM

## 2018-11-04 DIAGNOSIS — R937 Abnormal findings on diagnostic imaging of other parts of musculoskeletal system: Secondary | ICD-10-CM

## 2018-11-04 NOTE — Telephone Encounter (Signed)
Called patient regarding refill request on prednisone.  Informed patient that he will need to be seen. Patient also had questions about MRI.   No MRI has been ordered at this time and patient wants to know what to do from here

## 2018-11-06 NOTE — Telephone Encounter (Signed)
The MRI of his neck is finally scanned into his chart, please do a referral to neurosurgery for him

## 2018-11-07 NOTE — Addendum Note (Signed)
Addended by: Marylin Crosby on: 11/07/2018 08:43 AM   Modules accepted: Orders

## 2018-11-07 NOTE — Telephone Encounter (Signed)
Referral to neurosurgeon placed and pt is aware.

## 2018-12-08 ENCOUNTER — Encounter: Payer: Self-pay | Admitting: Family Medicine

## 2018-12-08 ENCOUNTER — Ambulatory Visit (INDEPENDENT_AMBULATORY_CARE_PROVIDER_SITE_OTHER): Payer: BLUE CROSS/BLUE SHIELD | Admitting: Family Medicine

## 2018-12-08 VITALS — BP 146/97 | HR 88 | Temp 96.7°F | Ht 71.5 in | Wt 205.4 lb

## 2018-12-08 DIAGNOSIS — D1801 Hemangioma of skin and subcutaneous tissue: Secondary | ICD-10-CM

## 2018-12-08 DIAGNOSIS — M542 Cervicalgia: Secondary | ICD-10-CM | POA: Diagnosis not present

## 2018-12-08 DIAGNOSIS — R030 Elevated blood-pressure reading, without diagnosis of hypertension: Secondary | ICD-10-CM

## 2018-12-08 DIAGNOSIS — R0981 Nasal congestion: Secondary | ICD-10-CM

## 2018-12-08 MED ORDER — FLUTICASONE PROPIONATE 50 MCG/ACT NA SUSP: 1.0000 | Freq: Two times a day (BID) | NASAL | 6 refills | Status: DC | PRN

## 2018-12-08 MED ORDER — PREDNISONE 20 MG PO TABS: ORAL_TABLET | ORAL | 0 refills | Status: DC

## 2018-12-08 MED ORDER — PAROXETINE HCL 10 MG PO TABS: 10.0000 mg | ORAL_TABLET | Freq: Every day | ORAL | 3 refills | Status: DC

## 2018-12-08 NOTE — Progress Notes (Signed)
BP (!) 146/97   Pulse 88   Temp (!) 96.7 F (35.9 C) (Oral)   Ht 5' 11.5" (1.816 m)   Wt 205 lb 6.4 oz (93.2 kg)   BMI 28.25 kg/m    Subjective:    Patient ID: Blake Jordan, male    DOB: March 10, 1981, 38 y.o.   MRN: [id number]  HPI: Blake Jordan is a 38 y.o. male presenting on 12/08/2018 for Neck Pain (Left side- apt with neuro 1/30); Dizziness (Patient states that the last few weeks he has been feeling dizzy. Also states that he has felt like he was flaoting in the air in the past.); and Nevus (on back )   HPI Lightheadedness and sinus pressure and congestion Patient comes in complaining of lightheadedness dizziness sinus pressure congestion is been going on for the past couple weeks.  He says he has this feeling that he is floating and he is lightheaded and almost like he is going to pass out.  He does complain of the sinus pressure and congestion from his allergies that has been increased over this time period as well.  He denies any ear pressure or pain or shortness of breath or wheezing.  Skin spot on back Patient comes in complaining of a new skin spot on his upper back that is itchy and he wanted get it checked out because he thought it was a new mole.  He says that his wife told him that it was red rather than brown and he wanted to get it checked out.  He denies any pain or bruising or swelling or bleeding associated with that that he is noticed yet.  Neck pain Patient has been having continued neck pain and he is waiting for an appointment with neurosurgery but it is still a couple weeks away and is wondering if there is something that we can do to help with it in the meantime.  Patient denies any weakness but does have some numbness and shooting pain on the left side of his face and down his left arm.  The neck pain is mostly on that left side as well.  He has tried a round of prednisone and stretching exercises with very little improvement overall.  He did feel better when  he was on the prednisone but it came right back as soon as he finished it.  Relevant past medical, surgical, family and social history reviewed and updated as indicated. Interim medical history since our last visit reviewed. Allergies and medications reviewed and updated.  Review of Systems  Constitutional: Negative for chills and fever.  HENT: Positive for congestion, sinus pressure and sneezing.   Eyes: Negative for visual disturbance.  Respiratory: Negative for shortness of breath and wheezing.   Cardiovascular: Negative for chest pain and leg swelling.  Musculoskeletal: Positive for neck pain. Negative for back pain, gait problem and neck stiffness.  Skin: Negative for rash.  Neurological: Positive for dizziness, light-headedness and numbness.  All other systems reviewed and are negative.   Per HPI unless specifically indicated above   Allergies as of 12/08/2018   No Known Allergies     Medication List       Accurate as of December 08, 2018  2:13 PM. Always use your most recent med list.        fluticasone 50 MCG/ACT nasal spray Commonly known as:  FLONASE Place 1 spray into both nostrils 2 (two) times daily as needed for allergies or rhinitis.   ibuprofen 600 MG tablet  Commonly known as:  ADVIL,MOTRIN Take 1 tablet (600 mg total) by mouth every 6 (six) hours as needed.   latanoprost 0.005 % ophthalmic solution Commonly known as:  XALATAN 1 drop at bedtime.   metroNIDAZOLE 0.75 % cream Commonly known as:  METROCREAM Apply topically 2 (two) times daily.   PARoxetine 10 MG tablet Commonly known as:  PAXIL Take 1 tablet (10 mg total) by mouth daily.   polyethylene glycol packet Commonly known as:  MIRALAX / GLYCOLAX Take 17 g by mouth daily as needed. hasnt used in a week   predniSONE 20 MG tablet Commonly known as:  DELTASONE 2 po at same time daily for 5 days          Objective:    BP (!) 146/97   Pulse 88   Temp (!) 96.7 F (35.9 C) (Oral)   Ht  5' 11.5" (1.816 m)   Wt 205 lb 6.4 oz (93.2 kg)   BMI 28.25 kg/m   Wt Readings from Last 3 Encounters:  12/08/18 205 lb 6.4 oz (93.2 kg)  09/26/18 200 lb 3.2 oz (90.8 kg)  06/01/18 201 lb 4 oz (91.3 kg)    Physical Exam Vitals signs and nursing note reviewed.  Constitutional:      General: He is not in acute distress.    Appearance: He is well-developed. He is not diaphoretic.  HENT:     Nose:     Right Sinus: Maxillary sinus tenderness and frontal sinus tenderness present.     Left Sinus: Maxillary sinus tenderness and frontal sinus tenderness present.     Mouth/Throat:     Mouth: Mucous membranes are moist.     Pharynx: Oropharynx is clear.  Eyes:     General: No scleral icterus.    Conjunctiva/sclera: Conjunctivae normal.  Neck:     Musculoskeletal: Neck supple.     Thyroid: No thyromegaly.  Cardiovascular:     Rate and Rhythm: Normal rate and regular rhythm.     Heart sounds: Normal heart sounds. No murmur.  Pulmonary:     Effort: Pulmonary effort is normal. No respiratory distress.     Breath sounds: Normal breath sounds. No wheezing.  Musculoskeletal: Normal range of motion.     Cervical back: He exhibits tenderness (Left-sided neck muscular tenderness).  Lymphadenopathy:     Cervical: No cervical adenopathy.  Skin:    General: Skin is warm and dry.     Findings: No rash.       Neurological:     Mental Status: He is alert and oriented to person, place, and time.     Coordination: Coordination normal.  Psychiatric:        Behavior: Behavior normal.        Assessment & Plan:   Problem List Items Addressed This Visit    None    Visit Diagnoses    Sinus congestion    -  Primary   Relevant Medications   fluticasone (FLONASE) 50 MCG/ACT nasal spray   Cervicalgia       Has an appointment with neurosurgery   Relevant Medications   predniSONE (DELTASONE) 20 MG tablet   Hemangioma of skin       Left upper back, small, gave patient reassurance   Elevated  BP without diagnosis of hypertension          Patient has sinus congestion and neck pain still going on, he does have an appointment with neurosurgery coming up.  Will give short course of  prednisone.  We gave reassurance for hemangioma.  Patient has elevated blood pressure today but he has been normal before this, think this is likely because of the pain and we will just monitor for now.  Follow up plan: Return if symptoms worsen or fail to improve.  Counseling provided for all of the vaccine components No orders of the defined types were placed in this encounter.   Caryl Pina, MD [facility] 12/08/2018, 2:13 PM

## 2018-12-20 ENCOUNTER — Telehealth: Payer: Self-pay | Admitting: Family Medicine

## 2018-12-20 DIAGNOSIS — Z87891 Personal history of nicotine dependence: Secondary | ICD-10-CM | POA: Diagnosis not present

## 2018-12-20 DIAGNOSIS — R42 Dizziness and giddiness: Secondary | ICD-10-CM | POA: Diagnosis not present

## 2018-12-20 DIAGNOSIS — R202 Paresthesia of skin: Secondary | ICD-10-CM | POA: Diagnosis not present

## 2018-12-20 DIAGNOSIS — R200 Anesthesia of skin: Secondary | ICD-10-CM | POA: Diagnosis not present

## 2018-12-20 DIAGNOSIS — Z79899 Other long term (current) drug therapy: Secondary | ICD-10-CM | POA: Diagnosis not present

## 2018-12-20 NOTE — Telephone Encounter (Signed)
Pt states he is having R sided facial numbness and R hand numbness with hand drawing up Pt also states he has had some L sided pt numbness Pt states he has appt with neurosurgeon on Thursday Pt will contact neurosurgeon for possible earlier appt or will go to ER for evaluation

## 2018-12-22 DIAGNOSIS — M546 Pain in thoracic spine: Secondary | ICD-10-CM | POA: Diagnosis not present

## 2018-12-29 DIAGNOSIS — M546 Pain in thoracic spine: Secondary | ICD-10-CM | POA: Diagnosis not present

## 2019-02-10 DIAGNOSIS — H401131 Primary open-angle glaucoma, bilateral, mild stage: Secondary | ICD-10-CM | POA: Diagnosis not present

## 2019-02-12 ENCOUNTER — Encounter: Payer: Self-pay | Admitting: Family Medicine

## 2019-02-14 ENCOUNTER — Telehealth (INDEPENDENT_AMBULATORY_CARE_PROVIDER_SITE_OTHER): Payer: BLUE CROSS/BLUE SHIELD | Admitting: Family

## 2019-02-14 ENCOUNTER — Other Ambulatory Visit: Payer: Self-pay

## 2019-02-14 ENCOUNTER — Telehealth: Payer: Self-pay | Admitting: Family Medicine

## 2019-02-14 DIAGNOSIS — Z20828 Contact with and (suspected) exposure to other viral communicable diseases: Secondary | ICD-10-CM

## 2019-02-14 DIAGNOSIS — R52 Pain, unspecified: Secondary | ICD-10-CM

## 2019-02-14 DIAGNOSIS — R6889 Other general symptoms and signs: Secondary | ICD-10-CM

## 2019-02-14 DIAGNOSIS — R05 Cough: Secondary | ICD-10-CM | POA: Diagnosis not present

## 2019-02-14 DIAGNOSIS — R51 Headache: Secondary | ICD-10-CM

## 2019-02-14 DIAGNOSIS — R509 Fever, unspecified: Secondary | ICD-10-CM | POA: Diagnosis not present

## 2019-02-14 DIAGNOSIS — R059 Cough, unspecified: Secondary | ICD-10-CM

## 2019-02-14 MED ORDER — OSELTAMIVIR PHOSPHATE 75 MG PO CAPS: 75.0000 mg | ORAL_CAPSULE | Freq: Two times a day (BID) | ORAL | 0 refills | Status: DC

## 2019-02-14 NOTE — Telephone Encounter (Signed)
Aware.  Has a  Virtual appointment today.

## 2019-02-14 NOTE — Progress Notes (Signed)
   Virtual Visit via telephone Note  I connected with Blake Jordan on 02/14/19 at 1:40 pm by telephone and verified that I am speaking with the correct person using two identifiers. Blake Jordan is currently located at home and no one is currently with her during visit. The provider, Evelina Dun, FNP is located in their office at time of visit.  I discussed the limitations, risks, security and privacy concerns of performing an evaluation and management service by telephone and the availability of in person appointments. I also discussed with the patient that there may be a patient responsible charge related to this service. The patient expressed understanding and agreed to proceed.   History and Present Illness:   PT complaining of cough, fever, body aches, headache,  and sneezing that started yesterday morning that is worsening. He states his fever has been 100. 3 F and taking tylenol, rest, and Dayquil as needed with mild relief.   States his son tested positive for Influenza A last week.      Observations/Objective: Tired, no cough noted  Assessment and Plan: 1. Cough  2. Flu-like symptoms - oseltamivir (TAMIFLU) 75 MG capsule; Take 1 capsule (75 mg total) by mouth 2 (two) times daily.  Dispense: 10 capsule; Refill: 0  3. Exposure to influenza  Given son has positive flu and pt has flu like symptoms we give tamiflu Rest Force fluids Droplet precautions  RTO if symptoms worsen or do not improve      I discussed the assessment and treatment plan with the patient. The patient was provided an opportunity to ask questions and all were answered. The patient agreed with the plan and demonstrated an understanding of the instructions.   The patient was advised to call back or seek an in-person evaluation if the symptoms worsen or if the condition fails to improve as anticipated.  The above assessment and management plan was discussed with the patient. The patient  verbalized understanding of and has agreed to the management plan. Patient is aware to call the clinic if symptoms persist or worsen. Patient is aware when to return to the clinic for a follow-up visit. Patient educated on when it is appropriate to go to the emergency department.    Call ended 1:51pm,  I provided 11 minutes of non-face-to-face time during this encounter.    Evelina Dun, FNP

## 2019-02-14 NOTE — Telephone Encounter (Signed)
What symptoms do you have? Sneezing, runny nose drainage, fever body aches  Fever of 99 How long have you been sick? Started yesterday   Have you been seen for this problem?has already sent msg to Dr. Keturah Barre on [address]  If your provider decides to give you a prescription, which pharmacy would you like for it to be sent to? [facility] [place]   Patient informed that this information will be sent to the clinical staff for review and that they should receive a follow up call.

## 2019-02-14 NOTE — Telephone Encounter (Signed)
Needs to be set up as virtual visit

## 2019-02-20 ENCOUNTER — Telehealth: Payer: Self-pay | Admitting: *Deleted

## 2019-02-20 NOTE — Telephone Encounter (Signed)
LMVM for pt (again to call back re: to appt scheduled tomorrow at 1030).

## 2019-02-20 NOTE — Telephone Encounter (Signed)
LMVM for pt re: appt scheduled tomorrow to call me back.  (noted 02-14-19  pcp note in Epic, related to fever, body aches).

## 2019-02-21 ENCOUNTER — Ambulatory Visit: Payer: BLUE CROSS/BLUE SHIELD | Admitting: Neurology

## 2019-02-21 NOTE — Telephone Encounter (Signed)
Spoke to pt yesterday around 1700.  He was back at work (heavy Psychologist, educational).  Afebrile now, finished 5 day course tamiflu (had seen pcp- telemedicine visit) 02-14-19 for flu.   He was having allergy sx (which is normal for him).  ? Keep in office appt or reschedule.  He had day off, as also had another appt afternoon with wife who is pregnant at ob/gyn. I told him that Dr. Felecia Shelling not available to ask but would check in am to see about keeping appt due to his recent course of sx.

## 2019-02-21 NOTE — Telephone Encounter (Signed)
LMVM for pt on mobile.  Cancelled in person appt today due to previous illness course,  but can convert to virtual video appt (need email) or can reschedule 6/20.  Please return call.

## 2019-03-30 ENCOUNTER — Other Ambulatory Visit: Payer: Self-pay

## 2019-03-30 DIAGNOSIS — R945 Abnormal results of liver function studies: Principal | ICD-10-CM

## 2019-03-30 DIAGNOSIS — R7989 Other specified abnormal findings of blood chemistry: Secondary | ICD-10-CM

## 2019-03-31 ENCOUNTER — Other Ambulatory Visit (INDEPENDENT_AMBULATORY_CARE_PROVIDER_SITE_OTHER): Payer: BLUE CROSS/BLUE SHIELD

## 2019-03-31 DIAGNOSIS — R945 Abnormal results of liver function studies: Secondary | ICD-10-CM

## 2019-03-31 DIAGNOSIS — R7989 Other specified abnormal findings of blood chemistry: Secondary | ICD-10-CM

## 2019-03-31 LAB — HEPATIC FUNCTION PANEL
ALT: 93 U/L — ABNORMAL HIGH (ref 0–53)
AST: 49 U/L — ABNORMAL HIGH (ref 0–37)
Albumin: 4.8 g/dL (ref 3.5–5.2)
Alkaline Phosphatase: 36 U/L — ABNORMAL LOW (ref 39–117)
Bilirubin, Direct: 0.1 mg/dL (ref 0.0–0.3)
Total Bilirubin: 0.6 mg/dL (ref 0.2–1.2)
Total Protein: 7.3 g/dL (ref 6.0–8.3)

## 2019-04-03 NOTE — Progress Notes (Signed)
[place],  Liver tests are back up some.  Will discuss more on Wednesday the 13th.  Gatha Mayer, MD, [facility]

## 2019-04-05 ENCOUNTER — Encounter: Payer: Self-pay | Admitting: Internal Medicine

## 2019-04-05 ENCOUNTER — Ambulatory Visit: Payer: BLUE CROSS/BLUE SHIELD | Admitting: Internal Medicine

## 2019-04-05 ENCOUNTER — Other Ambulatory Visit (INDEPENDENT_AMBULATORY_CARE_PROVIDER_SITE_OTHER): Payer: BLUE CROSS/BLUE SHIELD

## 2019-04-05 ENCOUNTER — Other Ambulatory Visit: Payer: Self-pay

## 2019-04-05 VITALS — BP 124/72 | HR 78 | Temp 97.6°F | Ht 71.0 in | Wt 223.0 lb

## 2019-04-05 DIAGNOSIS — R748 Abnormal levels of other serum enzymes: Secondary | ICD-10-CM | POA: Diagnosis not present

## 2019-04-05 DIAGNOSIS — K582 Mixed irritable bowel syndrome: Secondary | ICD-10-CM

## 2019-04-05 DIAGNOSIS — M6208 Separation of muscle (nontraumatic), other site: Secondary | ICD-10-CM

## 2019-04-05 DIAGNOSIS — R1084 Generalized abdominal pain: Secondary | ICD-10-CM | POA: Diagnosis not present

## 2019-04-05 DIAGNOSIS — K760 Fatty (change of) liver, not elsewhere classified: Secondary | ICD-10-CM | POA: Diagnosis not present

## 2019-04-05 LAB — IGA: IgA: 214 mg/dL (ref 68–378)

## 2019-04-05 NOTE — Progress Notes (Signed)
Blake Jordan 38 y.o. July 06, 1981 [id number]  Assessment & Plan:   Encounter Diagnoses  Name Primary?  . Abnormal transaminases Yes  . Fatty liver   . Diastasis recti   . Generalized abdominal pain   . Irritable bowel syndrome with both constipation and diarrhea-suspected     Most likely his transaminases are back up due to weight gain and association with fatty liver disease which was suspected based on previous serologic work-up and ultrasound.  Working diagnosis is been fatty liver disease.  I think he may have irritable bowel syndrome as well.  Diastases recti is evident.  He has anxiety about his health as well.   I have advised him to try to lose weight again and eliminate sugary drinks etc.  I am going to go ahead and check an anti-smooth muscle antibody and screen him for celiac disease which is a potential cause of these problems and could be a unifying diagnosis.  Further plans pending those results.  At a minimum would anticipate rechecking his LFTs in a few months after he is lost weight.   Subjective:   Chief Complaint: Abdominal pain fatty liver abnormal transaminases  HPI Blake Jordan is a 38 year old man with rosacea anxiety and fatty liver disease.  He messaged me recently complaining of some abdominal discomfort, his weight had gone up, he wanted to have his LFTs checked again.  We did so and he had normalized his transaminases last year with weight loss but now they are abnormal again.  See below.  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   Wt Readings from Last 3 Encounters:  04/05/19 223 lb (101.2 kg)  12/08/18 205 lb 6.4 oz (93.2 kg)  09/26/18 200 lb 3.2 oz (90.8 kg)   He is also had a little bit of an ache down into his right testicle which is gone.  He thinks he  might of had a kidney stone.  He is asymptomatic in that regard now.  He does have abdominal discomfort.  When he bends over it hurts.  There is a bulge there though he realizes some of that is from his weight gain.  Stool seem to be soft though sometimes start out hard and then become soft.  Controllable.  He had tended towards constipation in the past.  Social history notable for that his wife is pregnant this will be their third child together he has 2 other children from a previous relationship No Known Allergies Current Meds  Medication Sig  . acetaminophen (TYLENOL) 500 MG tablet Take 500 mg by mouth every 6 (six) hours as needed.  . cetirizine (ZYRTEC) 10 MG tablet Take 10 mg by mouth daily as needed for allergies.  Marland Kitchen ibuprofen (ADVIL,MOTRIN) 600 MG tablet Take 1 tablet (600 mg total) by mouth every 6 (six) hours as needed.  . latanoprost (XALATAN) 0.005 % ophthalmic solution 1 drop at bedtime.  . metroNIDAZOLE (METROCREAM) 0.75 % cream Apply topically 2 (two) times daily.  Marland Kitchen PARoxetine (PAXIL) 10 MG tablet Take 1 tablet (10 mg total) by mouth daily.  . polyethylene glycol (MIRALAX / GLYCOLAX) packet Take 17 g by mouth daily as needed. hasnt used in a week    Past Medical History:  Diagnosis Date  . Fatty liver   . GERD (gastroesophageal  reflux disease)   . Glaucoma    potentially will have - needs eye drops  . History of kidney stones   . Hypercholesterolemia    Past Surgical History:  Procedure Laterality Date  . CARDIOVASCULAR STRESS TEST    . 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   Review of Systems Treated for the flu, had a febrile illness in  the spring.  Received Tamiflu.  That lasted about 5 days.  Objective:   Physical Exam BP 124/72   Pulse 78   Temp 97.6 F (36.4 C)   Ht 5\' 11"  (1.803 m) Comment: without shoes  Wt 223 lb (101.2 kg)   BMI 31.10 kg/m  No acute distress Eyes anicteric Abdomen is soft, there is a midline diastases recti, he has mildly diffusely tender without organomegaly or mass or peritoneal signs.  He has a negative Carnetts sign.   Lab Results  Component Value Date   ALT 93 (H) 03/31/2019   AST 49 (H) 03/31/2019   ALKPHOS 36 (L) 03/31/2019   BILITOT 0.6 03/31/2019

## 2019-04-05 NOTE — Patient Instructions (Addendum)
I think eliminating sugary drinks and excess sugar and losing some weight again will help you most likely.  As I look over things I have decided to check you for gluten allergy or celiac disease, that could potentially be at play and to do another autoimmune test.  The diastases recti is a bulge of the abdominal wall and not dangerous but can be a nuisance.  I think you probably have some irritable bowel syndrome which is causing the softer stools.  You do not need a specific treatment for that if you can deal with it.  Let us monitor things.  I would not pursue a colonoscopy.  If you have celiac disease it could cause the liver issues and the bowel issues.  I will let you know.   Diastasis Recti  Diastasis recti is when the muscles of the abdomen (rectus abdominis muscles) become thin and separate. The result is a wider space between the right and left abdomen (abdominal) muscles. This wider space between the muscles may cause a bulge in the middle of your abdomen. You may notice this bulge when you are straining or when you sit up from a lying down position. Diastasis recti can affect men and women. It is most common among pregnant women, infants, people who are obese, and people who have had abdominal surgery. Exercise or surgical treatment may help correct it. What are the causes? Common causes of this condition include:  Pregnancy. The growing uterus puts pressure on the abdominal muscles, which causes the muscles to separate.  Obesity. Excess fat puts pressure on abdominal muscles.  Weightlifting.  Some abdomen exercises.  Advanced age.  Genetics.  Prior abdominal surgery. What increases the risk? This condition is more likely to develop in:  Women.  Newborns, especially newborns who are born early (prematurely). What are the signs or symptoms? Common symptoms of this condition include:  A bulge in the middle of the abdomen. You will notice it most when you sit up or  strain.  Pain in the low back, pelvis, or hips.  Constipation.  Inability to control when you urinate (urinary incontinence).  Bloating.  Poor posture. How is this diagnosed? This condition is diagnosed with a physical exam. Your health care provider will ask you to lie flat on your back and do a crunch or half sit-up. If you have diastasis recti, a vertical bulge will appear between your abdominal muscles in the center of your abdomen. Your health care provider will measure the gap between your muscles with one of the following:  A medical device used to measure the space between two objects (caliper).  A tape measure.  CT scan.  Ultrasound.  Finger spaces. Your health care provider will measure the space using their fingers. How is this treated? If your muscle separation is not too large, you may not need treatment. However, if you are a woman who plans to become pregnant again, you should treat this condition before your next pregnancy. Treatment may include:  Physical therapy to strengthen and tighten your abdominal muscles.  Lifestyle changes such as weight loss and exercise.  Over-the-counter pain medicines as needed.  Surgery to correct the separation. Follow these instructions at home: Activity  Return to your normal activities as told by your health care provider. Ask your health care provider what activities are safe for you.  When lifting weights or doing exercises using your abdominal muscles or the muscles in the center of your body that give stability (core muscles),  make sure you are doing your exercises and movements correctly. Proper form can help to prevent the condition from happening again. General instructions  If you are overweight, ask your health care provider for help with weight loss. Losing even a small amount of weight can help to improve your diastasis recti.  Take over-the-counter or prescription medicines only as told by your health care  provider.  Do not strain. Straining can make the separation worse. Examples of straining include: ? Pushing hard to have a bowel movement, such as due to constipation. ? Lifting heavy objects, including children. ? Standing up and sitting down.  Take steps to prevent constipation: ? Drink enough fluid to keep your urine clear or pale yellow. ? Take over-the-counter or prescription medicines only as directed. ? Eat foods that are high in fiber, such as fresh fruits and vegetables, whole grains, and beans. ? Limit foods that are high in fat and processed sugars, such as fried and sweet foods. Contact a health care provider if:  You notice a new bulge in your abdomen. Get help right away if:  You experience severe discomfort in your abdomen.  You develop severe abdominal pain along with nausea, vomiting, or fever. Summary  Diastasis recti is when the abdomen (abdominal) muscles become thin and separate. Your abdomen will stick out because the space between your right and left abdomen muscles has widened.  The most common symptom is a bulge in your abdomen. You will notice it most when you sit up or are straining.  This condition is diagnosed during a physical exam.  If the abdomen separation is not too big, you may choose not to have treatment. Otherwise, you may need to undergo physical therapy or surgery. This information is not intended to replace advice given to you by your health care provider. Make sure you discuss any questions you have with your health care provider. Document Released: 01/04/2017 Document Revised: 01/04/2017 Document Reviewed: 01/04/2017 Elsevier Interactive Patient Education  2019 [employer]  Irritable Bowel Syndrome, Adult  Irritable bowel syndrome (IBS) is a group of symptoms that affects the organs responsible for digestion (gastrointestinal or GI tract). IBS is not one specific disease. To regulate how the GI tract works, the body sends signals back  and forth between the intestines and the brain. If you have IBS, there may be a problem with these signals. As a result, the GI tract does not function normally. The intestines may become more sensitive and overreact to certain things. This may be especially true when you eat certain foods or when you are under stress. There are four types of IBS. These may be determined based on the consistency of your stool (feces):  IBS with diarrhea.  IBS with constipation.  Mixed IBS.  Unsubtyped IBS. It is important to know which type of IBS you have. Certain treatments are more likely to be helpful for certain types of IBS. What are the causes? The exact cause of IBS is not known. What increases the risk? You may have a higher risk for IBS if you:  Are male.  Are younger than 76.  Have a family history of IBS.  Have a mental health condition, such as depression, anxiety, or post-traumatic stress disorder.  Have had a bacterial infection of your GI tract. What are the signs or symptoms? Symptoms of IBS vary from person to person. The main symptom is abdominal pain or discomfort. Other symptoms usually include one or more of the following:  Diarrhea,  constipation, or both.  Abdominal swelling or bloating.  Feeling full after eating a small or regular-sized meal.  Frequent gas.  Mucus in the stool.  A feeling of having more stool left after a bowel movement. Symptoms tend to come and go. They may be triggered by stress, mental health conditions, or certain foods. How is this diagnosed? This condition may be diagnosed based on a physical exam, your medical history, and your symptoms. You may have tests, such as:  Blood tests.  Stool test.  X-rays.  CT scan.  Colonoscopy. This is a procedure in which your GI tract is viewed with a long, thin, flexible tube. How is this treated? There is no cure for IBS, but treatment can help relieve symptoms. Treatment depends on the type of  IBS you have, and may include:  Changes to your diet, such as: ? Avoiding foods that cause symptoms. ? Drinking more water. ? Following a low-FODMAP (fermentable oligosaccharides, disaccharides, monosaccharides, and polyols) diet for up to 6 weeks, or as told by your health care provider. FODMAPs are sugars that are hard for some people to digest. ? Eating more fiber. ? Eating medium-sized meals at the same times every day.  Medicines. These may include: ? Fiber supplements, if you have constipation. ? Medicine to control diarrhea (antidiarrheal medicines). ? Medicine to help control muscle tightening (spasms) in your GI tract (antispasmodic medicines). ? Medicines to help with mental health conditions, such as antidepressants or tranquilizers.  Talk therapy or counseling.  Working with a diet and nutrition specialist (dietitian) to help create a food plan that is right for you.  Managing your stress. Follow these instructions at home: Eating and drinking  Eat a healthy diet.  Eat medium-sized meals at about the same time every day. Do not eat large meals.  Gradually eat more fiber-rich foods. These include whole grains, fruits, and vegetables. This may be especially helpful if you have IBS with constipation.  Eat a diet low in FODMAPs.  Drink enough fluid to keep your urine pale yellow.  Keep a journal of foods that seem to trigger symptoms.  Avoid foods and drinks that: ? Contain added sugar. ? Make your symptoms worse. Dairy products, caffeinated drinks, and carbonated drinks can make symptoms worse for some people. General instructions  Take over-the-counter and prescription medicines and supplements only as told by your health care provider.  Get enough exercise. Do at least 150 minutes of moderate-intensity exercise each week.  Manage your stress. Getting enough sleep and exercise can help you manage stress.  Keep all follow-up visits as told by your health care  provider and therapist. This is important. Alcohol Use  Do not drink alcohol if: ? Your health care provider tells you not to drink. ? You are pregnant, may be pregnant, or are planning to become pregnant.  If you drink alcohol, limit how much you have: ? 0-1 drink a day for women. ? 0-2 drinks a day for men.  Be aware of how much alcohol is in your drink. In the [place] one drink equals one typical bottle of beer (12 oz), one-half glass of wine (5 oz), or one shot of hard liquor (1 oz). Contact a health care provider if you have:  Constant pain.  Weight loss.  Difficulty or pain when swallowing.  Diarrhea that gets worse. Get help right away if you have:  Severe abdominal pain.  Fever.  Diarrhea with symptoms of dehydration, such as dizziness or dry mouth.  Bright red blood in your stool.  Stool that is black and tarry.  Abdominal swelling.  Vomiting that does not stop.  Blood in your vomit. Summary  Irritable bowel syndrome (IBS) is not one specific disease. It is a group of symptoms that affects digestion.  Your intestines may become more sensitive and overreact to certain things. This may be especially true when you eat certain foods or when you are under stress.  There is no cure for IBS, but treatment can help relieve symptoms. This information is not intended to replace advice given to you by your health care provider. Make sure you discuss any questions you have with your health care provider. Document Released: 11/09/2005 Document Revised: 11/02/2017 Document Reviewed: 11/02/2017 Elsevier Interactive Patient Education  2019 [employer]  Celiac Disease  Celiac disease is an allergy to the protein called gluten. When a person with celiac disease eats a food that has gluten in it, his or her natural defense system (immune system) attacks the cells that line the small intestine. Over time, this reaction damages the small intestine and makes the small  intestine unable to absorb nutrients from food. Gluten is found in wheat, rye, and barley and in foods like pasta, pizza, and cereal. Celiac disease is also known as celiac sprue, nontropical sprue, and gluten-sensitive enteropathy. What are the causes? This condition is caused by a gene that is passed down through families (inherited). What increases the risk? You are more likely to develop this condition if you:  Have a family member with the disease.  Have an autoimmune condition, such as type 1 diabetes or a thyroid disorder.  Are male. What are the signs or symptoms? Symptoms of this condition include:  Recurring bloating and pain in the abdomen.  Gas.  Long-term (chronic) diarrhea.  Pale, bad-smelling, greasy, or oily stool.  Weight loss.  Missed menstrual periods.  Weakening bones (osteoporosis).  Fatigue and weakness.  Tingling or other signs of nerve damage.  Depression.  Poor appetite.  Rash. In some cases, there are no symptoms of this condition. How is this diagnosed? This condition is diagnosed with a physical exam and tests. Tests may include:  Blood tests to check for nutritional deficiencies.  Blood tests to look for evidence that the body is attacking cells in the small intestine.  A test in which a sample of tissue is taken from the small intestine and examined under a microscope (biopsy).  X-rays of the intestine.  Stool tests.  Tests to check for nutrient absorption from the intestine. How is this treated? There is no cure for this condition, but it may be managed with a gluten-free diet.  Treatment may also involve avoiding dairy foods, such as milk and cheese, because they are hard to digest.  Most people who follow a gluten-free diet feel better and stop having symptoms.  The intestine usually heals within 3 months to 2 years. In a small percentage of people, this condition does not improve on the gluten-free diet. If your  condition does not improve, more tests will be done. You will also need to work with a specialist in celiac disease to find the best treatment for you. Follow these instructions at home:   Follow instructions from your health care provider about diet.  Monitor your body's response to the gluten-free diet. Write down any changes in your symptoms and changes in how you feel.  If you decide to eat outside of the home, prepare your meal ahead of  time, or make sure that the place where you are going has gluten-free options.  Keep all follow-up visits as told by your health care provider. This is important.  Suggest to family members that they get screened for early signs of the disease. Contact a health care provider if:  You continue to have symptoms, even when you are eating a gluten-free diet.  You have trouble sticking to the gluten-free diet.  You develop an itchy rash with groups of tiny blisters.  You develop severe weakness.  You develop balance problems.  You develop new symptoms. Summary  Celiac disease is an allergy to the protein called gluten. Over time, this reaction damages the small intestine and makes the small intestine unable to absorb nutrients from food.  There is no cure for this condition, but it may be managed with a gluten-free diet and by avoiding dairy foods.  Follow instructions from your health care provider about diet. Write down any changes in your symptoms and changes in how you feel.  Contact a health care provider if you continue to have symptoms, even when you are eating a gluten-free diet, or you have new symptoms.  Keep all follow-up visits as told by your health care provider. This is important. This information is not intended to replace advice given to you by your health care provider. Make sure you discuss any questions you have with your health care provider. Document Released: 11/09/2005 Document Revised: 03/30/2018 Document Reviewed:  03/30/2018 Elsevier Interactive Patient Education  2019 [employer].  Celiac Disease Antibodies Test Why am I having this test? The celiac disease antibodies test is a blood test that is done to help diagnose celiac disease. People who have celiac disease cannot tolerate the proteins gluten and gliadin, which are found in wheat and wheat products. You may have the test if you have symptoms of celiac disease. These include:  Long-term (chronic) diarrhea.  Belly (abdominal) pain.  Weight loss. You may also have this test if you have increased risk for celiac disease, such as having a close family member who has the disease. What is being tested? This test examines your blood for the presence of specific antibodies that are common to celiac disease. Antibodies are proteins that your body normally makes to protect itself from germs that can make you sick. In people who have celiac disease, the body produces antibodies in response to the proteins gluten and gliadin. What kind of sample is taken?  A blood sample is required for this test. It is usually collected by inserting a needle into a blood vessel. How do I prepare for this test? You may be asked to provide a list of foods that you have eaten in the 48 hours before the test. If you have eaten foods that contain gluten, the test will show a strong antibody response if you do have celiac disease. Check with your health care provider for specific instructions. How are the results reported? Some of your test results will be reported as values. Your health care provider will compare your results to normal ranges that were established after testing a large group of people (reference ranges). Reference ranges may vary among labs and hospitals. For this test, common reference ranges for the three common celiac disease antibodies are as follows:  Gliadin IgA and IgG: ? Birth to 36 years of age: less than 44 EU. ? 45 years of age and older: less than  25 EU.  Tissue transglutaminase IgA, all ages: less  than 20 EU. Other results will be reported as positive or negative. For this test, normal results are:  Negative for endomysial IgA, all ages. What do the results mean? High levels of antibodies or a positive result may mean that you have celiac disease. High levels of gliadin antibodies may also be caused by Crohn's disease, colitis, and severe lactose intolerance. Talk with your health care provider about what your results mean. Questions to ask your health care provider Ask your health care provider, or the department that is doing the test:  When will my results be ready?  How will I get my results?  What are my treatment options?  What other tests do I need?  What are my next steps? Summary  The celiac disease antibodies test is a blood test that is done to help diagnose celiac disease.  This test examines your blood for the presence of specific antibodies that are common in people who have celiac disease.  The presence of certain antibodies, or antibody levels that are above the normal range, may indicate that you have celiac disease.  Talk with your health care provider about what your results mean. This information is not intended to replace advice given to you by your health care provider. Make sure you discuss any questions you have with your health care provider. Document Released: 12/03/2004 Document Revised: 07/20/2017 Document Reviewed: 07/20/2017 Elsevier Interactive Patient Education  2019 [employer].

## 2019-04-07 LAB — ANTI-SMOOTH MUSCLE ANTIBODY, IGG: Actin (Smooth Muscle) Antibody (IGG): <20 U (ref <20)

## 2019-04-07 LAB — TISSUE TRANSGLUTAMINASE, IGA: (tTG) Ab, IgA: 1 U/mL

## 2019-04-11 ENCOUNTER — Other Ambulatory Visit: Payer: Self-pay | Admitting: Internal Medicine

## 2019-04-11 DIAGNOSIS — K760 Fatty (change of) liver, not elsewhere classified: Secondary | ICD-10-CM

## 2019-04-11 NOTE — Progress Notes (Signed)
Eber,  You do not have celiac disease (gluten allergy). The other autoimmune test I did was negative (good result).  Since you had normal liver tests after losing weight before would think that would help again.  Please try to elimnate sugary drinks and processed carbohydrates again (or greatly reduce) and let's test the liver again at the beginning of august. I placed orders for you to get labs in the first week of August at my lab.  Let me know if any other ?'s  Gatha Mayer, MD, [facility]

## 2019-05-18 DIAGNOSIS — N50812 Left testicular pain: Secondary | ICD-10-CM | POA: Diagnosis not present

## 2019-05-18 DIAGNOSIS — M545 Low back pain: Secondary | ICD-10-CM | POA: Diagnosis not present

## 2019-05-19 DIAGNOSIS — N50812 Left testicular pain: Secondary | ICD-10-CM | POA: Diagnosis not present

## 2019-05-21 DIAGNOSIS — M545 Low back pain: Secondary | ICD-10-CM | POA: Diagnosis not present

## 2019-05-21 DIAGNOSIS — N50812 Left testicular pain: Secondary | ICD-10-CM | POA: Diagnosis not present

## 2019-05-21 DIAGNOSIS — R1084 Generalized abdominal pain: Secondary | ICD-10-CM | POA: Diagnosis not present

## 2019-06-30 DIAGNOSIS — M542 Cervicalgia: Secondary | ICD-10-CM | POA: Diagnosis not present

## 2019-06-30 DIAGNOSIS — M546 Pain in thoracic spine: Secondary | ICD-10-CM | POA: Diagnosis not present

## 2019-07-27 DIAGNOSIS — M542 Cervicalgia: Secondary | ICD-10-CM | POA: Diagnosis not present

## 2019-08-01 DIAGNOSIS — M542 Cervicalgia: Secondary | ICD-10-CM | POA: Diagnosis not present

## 2019-08-08 DIAGNOSIS — M542 Cervicalgia: Secondary | ICD-10-CM | POA: Diagnosis not present

## 2019-08-14 DIAGNOSIS — M542 Cervicalgia: Secondary | ICD-10-CM | POA: Diagnosis not present

## 2019-08-15 ENCOUNTER — Ambulatory Visit: Payer: BC Managed Care – PPO | Admitting: Neurology

## 2019-08-15 DIAGNOSIS — H401131 Primary open-angle glaucoma, bilateral, mild stage: Secondary | ICD-10-CM | POA: Diagnosis not present

## 2019-08-21 DIAGNOSIS — M542 Cervicalgia: Secondary | ICD-10-CM | POA: Diagnosis not present

## 2019-08-22 ENCOUNTER — Encounter: Payer: BC Managed Care – PPO | Admitting: Neurology

## 2019-08-28 DIAGNOSIS — M542 Cervicalgia: Secondary | ICD-10-CM | POA: Diagnosis not present

## 2019-09-14 DIAGNOSIS — M542 Cervicalgia: Secondary | ICD-10-CM | POA: Diagnosis not present

## 2019-09-22 ENCOUNTER — Other Ambulatory Visit: Payer: Self-pay | Admitting: Internal Medicine

## 2019-09-22 ENCOUNTER — Other Ambulatory Visit (INDEPENDENT_AMBULATORY_CARE_PROVIDER_SITE_OTHER): Payer: BLUE CROSS/BLUE SHIELD

## 2019-09-22 DIAGNOSIS — K760 Fatty (change of) liver, not elsewhere classified: Secondary | ICD-10-CM

## 2019-09-22 LAB — HEPATIC FUNCTION PANEL
ALT: 35 U/L (ref 0–53)
AST: 19 U/L (ref 0–37)
Albumin: 5.9 g/dL — ABNORMAL HIGH (ref 3.5–5.2)
Alkaline Phosphatase: 46 U/L (ref 39–117)
Bilirubin, Direct: 0.1 mg/dL (ref 0.0–0.3)
Total Bilirubin: 0.5 mg/dL (ref 0.2–1.2)
Total Protein: 7.1 g/dL (ref 6.0–8.3)

## 2019-09-26 ENCOUNTER — Other Ambulatory Visit: Payer: Self-pay | Admitting: Internal Medicine

## 2019-09-26 DIAGNOSIS — K760 Fatty (change of) liver, not elsewhere classified: Secondary | ICD-10-CM

## 2019-09-26 NOTE — Progress Notes (Signed)
Sabin  The liver tests are back to normal. Good work! The elevated albumin is unusual but I do not think it is a problem. Are you using any protein supplements?  Let us recheck Liver tests in first week of January - I ordered them so just come in then.  Here is some other nutrition information you may find helpful.   Healthy and nutritious eating and weight loss are made to be harder than they need to be. A simplified diet approach based around eating normally, as much as you want without restricting intake too much is better, I think. You must avoid packaged foods and try to eat real foods as much as possible. Packaged foods, sugary sodas, highly processed foods taste great but are slow poisons that lead to obesity and/or poor health. Planning your meals for the week is very helpful and can be done on the weekend.  Some resources that I like are:  www.gaplesinstitute.org (Do the learning modules about healthy eating)  www.dietdoctor.com - helps with low carb diets and also can learn about and consider intermittent  fasting. If you have diabetes would not do intermittent fasting without checking with your doctor. Best to change what you eat before doing this.  Www.drberry.com   I recommend you check your blood sugar daily and keep a log. If you are diabetic and check sugars.  Here are some guidelines to help you with meal planning -  Avoid all processed and packaged foods (bread, pasta, crackers, chips, etc) and beverages containing calories.  Avoid added sugars and excessive natural sugars.  Attention to how you feel if you consume artificial sweeteners.  Do they make you more hungry or raise your blood sugar?  With every meal and snack, aim to get 20 g of protein (3 ounces of meat, 4 ounces of fish, 3 eggs, protein powder, 1 cup [place] yogurt, 1 cup cottage cheese, etc.)  Increase fiber in the form of non-starchy vegetables.  These help you feel full with very little carbohydrates and  are good for gut health.  Eat 1 serving healthy carb per meal- 1/2 cup brown rice, beans, potato, corn- pay attention to whether or not this significantly raises your blood sugar if you are checking blood sugars. If it does, reduce the frequency you consume these.   Eat 2-3 servings of lower sugar fruits daily.  This includes berries, apples, oranges, peaches, pears, one half banana.  Have small amounts of good fats such as avocado, nuts, olive oil, nut butters, olives.  Add a little cheese to your salads to make them tasty.

## 2019-10-04 ENCOUNTER — Telehealth: Payer: Self-pay | Admitting: Family Medicine

## 2019-10-04 DIAGNOSIS — R002 Palpitations: Secondary | ICD-10-CM | POA: Diagnosis not present

## 2019-10-04 DIAGNOSIS — M79602 Pain in left arm: Secondary | ICD-10-CM | POA: Diagnosis not present

## 2019-10-04 DIAGNOSIS — Z79899 Other long term (current) drug therapy: Secondary | ICD-10-CM | POA: Diagnosis not present

## 2019-10-04 DIAGNOSIS — R079 Chest pain, unspecified: Secondary | ICD-10-CM | POA: Diagnosis not present

## 2019-10-04 DIAGNOSIS — Z87891 Personal history of nicotine dependence: Secondary | ICD-10-CM | POA: Diagnosis not present

## 2019-10-04 NOTE — Telephone Encounter (Signed)
Patient has a mole that is red and irritated.  Needs someone to look at it to see if it can be removed or if he needs referral to dermatologist.  Also, would like to get testosterone level checked.  Appointment made on 10/06/19 with Monia Pouch for the irritated mole.  Explained to patient that he may need to see Dr. Warrick Parisian to discuss testosterone.

## 2019-10-05 ENCOUNTER — Other Ambulatory Visit: Payer: Self-pay

## 2019-10-06 ENCOUNTER — Ambulatory Visit: Payer: BC Managed Care – PPO | Admitting: Family Medicine

## 2019-10-06 ENCOUNTER — Encounter: Payer: Self-pay | Admitting: Family Medicine

## 2019-10-06 VITALS — BP 126/66 | HR 72 | Temp 98.7°F | Resp 20 | Ht 71.0 in | Wt 206.0 lb

## 2019-10-06 DIAGNOSIS — R6882 Decreased libido: Secondary | ICD-10-CM

## 2019-10-06 DIAGNOSIS — R5383 Other fatigue: Secondary | ICD-10-CM

## 2019-10-06 DIAGNOSIS — Z6828 Body mass index (BMI) 28.0-28.9, adult: Secondary | ICD-10-CM

## 2019-10-06 DIAGNOSIS — D1801 Hemangioma of skin and subcutaneous tissue: Secondary | ICD-10-CM | POA: Diagnosis not present

## 2019-10-06 DIAGNOSIS — D229 Melanocytic nevi, unspecified: Secondary | ICD-10-CM | POA: Diagnosis not present

## 2019-10-06 DIAGNOSIS — R202 Paresthesia of skin: Secondary | ICD-10-CM

## 2019-10-06 NOTE — Progress Notes (Signed)
Subjective:  Patient ID: Blake Jordan, male    DOB: 09-Jul-1981, 38 y.o.   MRN: [id number]  Patient Care Team: Dettinger, Fransisca Kaufmann, MD as PCP - General (Family Medicine)   Chief Complaint:  irritation of mole   HPI: Blake Jordan is a 38 y.o. male presenting on 10/06/2019 for irritation of mole   Pt presents today for an irritated and abnormal skin lesion to his left upper back. Pt states this has been there for a while but has recently increased in size and the color has changed. Pt states it is painful at times. Pt states he has never had it evaluated in the past.  Pt states he is also having significant fatigue, lack of libido, paresthesias in his arms, and chronic pain in his arms. Pt states this has been ongoing for several months but worse over the last few weeks. States he is seeing PT for his arm pain and numbness. States this does not seem to be helping. States he has lost a lot of weight due to diet and exercise. States he does not feel better since he lost weight.     Relevant past medical, surgical, family, and social history reviewed and updated as indicated.  Allergies and medications reviewed and updated. Date reviewed: Chart in Epic.   Past Medical History:  Diagnosis Date  . Fatty liver   . GERD (gastroesophageal reflux disease)   . Glaucoma    potentially will have - needs eye drops  . History of kidney stones   . Hypercholesterolemia     Past Surgical History:  Procedure Laterality Date  . CARDIOVASCULAR STRESS TEST    . DENTAL RESTORATION/EXTRACTION WITH X-RAY    . ESOPHAGOGASTRODUODENOSCOPY  2018    Social History   Socioeconomic History  . Marital status: Married    Spouse name: Not on file  . Number of children: 4  . Years of education: Not on file  . Highest education level: Not on file  Occupational History  . 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 Financial resource strain: Not on file  . Food insecurity    Worry:  Not on file    Inability: Not on file  . Transportation needs    Medical: Not on file    Non-medical: Not on file  Tobacco Use  . Smoking status: Former Smoker    Quit date: 11/23/2013    Years since quitting: 5.8  . Smokeless tobacco: Former Systems developer    Quit date: 11/24/2015  Substance and Sexual Activity  . Alcohol use: No  . Drug use: No  . Sexual activity: Yes    Partners: Female  Lifestyle  . Physical activity    Days per week: Not on file    Minutes per session: Not on file  . Stress: Not on file  Relationships  . Social Herbalist on phone: Not on file    Gets together: Not on file    Attends religious service: Not on file    Active member of club or organization: Not on file    Attends meetings of clubs or organizations: Not on file    Relationship status: Not on file  . Intimate partner violence    Fear of current or ex partner: Not on file    Emotionally abused: Not on file    Physically abused: Not on file    Forced sexual activity: Not on file  Other Topics Concern  .  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 baclofen (LIORESAL) 10 MG tablet Take 10 mg by mouth 2 (two) times daily as needed.  . cyclobenzaprine (FLEXERIL) 5 MG tablet Take 5-10 mg by mouth at bedtime as needed.  . latanoprost (XALATAN) 0.005 % ophthalmic solution 1 drop at bedtime.  . meloxicam (MOBIC) 15 MG tablet Take 15 mg by mouth daily as needed.  . methocarbamol (ROBAXIN) 500 MG tablet Take 500 mg by mouth every 6 (six) hours as needed.  . metroNIDAZOLE (METROCREAM) 0.75 % cream Apply topically 2 (two) times daily.  . polyethylene glycol (MIRALAX / GLYCOLAX) packet Take 17 g by mouth daily as needed. hasnt used in a week   .  HYDROcodone-acetaminophen (NORCO/VICODIN) 5-325 MG tablet Take 1 tablet by mouth every 6 (six) hours as needed.  . [DISCONTINUED] acetaminophen (TYLENOL) 500 MG tablet Take 500 mg by mouth every 6 (six) hours as needed.  . [DISCONTINUED] cetirizine (ZYRTEC) 10 MG tablet Take 10 mg by mouth daily as needed for allergies.  . [DISCONTINUED] ibuprofen (ADVIL,MOTRIN) 600 MG tablet Take 1 tablet (600 mg total) by mouth every 6 (six) hours as needed.  . [DISCONTINUED] PARoxetine (PAXIL) 10 MG tablet Take 1 tablet (10 mg total) by mouth daily.   No facility-administered encounter medications on file as of 10/06/2019.     No Known Allergies  Review of Systems  Constitutional: Positive for fatigue. Negative for activity change, appetite change, chills, diaphoresis, fever and unexpected weight change.  HENT: Negative.   Eyes: Negative.  Negative for photophobia and visual disturbance.  Respiratory: Negative for cough, chest tightness and shortness of breath.   Cardiovascular: Negative for chest pain, palpitations and leg swelling.  Gastrointestinal: Negative for abdominal distention, abdominal pain, anal bleeding, blood in stool, constipation, diarrhea, nausea and vomiting.  Endocrine: Negative.   Genitourinary: Negative for decreased urine volume, difficulty urinating, discharge, dysuria, enuresis, flank pain, frequency, genital sores, hematuria, penile pain, penile swelling, scrotal swelling, testicular pain and urgency.       Decreased libido  Musculoskeletal: Positive for arthralgias, back pain and myalgias. Negative for gait problem, joint swelling, neck pain and neck stiffness.  Skin: Positive for color change.       Abnormal mole to upper back  Allergic/Immunologic: Negative.   Neurological: Positive for numbness (paresthesia to bilateral upper arms). Negative for dizziness, tremors, seizures, syncope, facial asymmetry, speech difficulty, weakness, light-headedness and headaches.   Hematological: Negative.   Psychiatric/Behavioral: Negative for confusion, hallucinations, sleep disturbance and suicidal ideas.  All other systems reviewed and are negative.       Objective:  BP 126/66   Pulse 72   Temp 98.7 F (37.1 C)   Resp 20   Ht '5\' 11"'$  (1.803 m)   Wt 206 lb (93.4 kg)   SpO2 98%   BMI 28.73 kg/m    Wt Readings from Last 3 Encounters:  10/06/19 206 lb (93.4 kg)  04/05/19 223 lb (101.2 kg)  12/08/18 205 lb 6.4 oz (93.2 kg)    Physical Exam Vitals signs and nursing note reviewed.  Constitutional:      General: He is not in acute distress.    Appearance: Normal appearance. He is well-developed and  well-groomed. He is not ill-appearing, toxic-appearing or diaphoretic.  HENT:     Head: Normocephalic and atraumatic.     Jaw: There is normal jaw occlusion.     Right Ear: Hearing, tympanic membrane and ear canal normal.     Left Ear: Hearing, tympanic membrane, ear canal and external ear normal.     Nose: Nose normal.     Mouth/Throat:     Lips: Pink.     Mouth: Mucous membranes are moist.     Pharynx: Oropharynx is clear. Uvula midline.  Eyes:     General: Lids are normal.     Extraocular Movements: Extraocular movements intact.     Conjunctiva/sclera: Conjunctivae normal.     Pupils: Pupils are equal, round, and reactive to light.  Neck:     Musculoskeletal: Normal range of motion and neck supple.     Thyroid: No thyroid mass, thyromegaly or thyroid tenderness.     Vascular: No carotid bruit or JVD.     Trachea: Trachea and phonation normal.  Cardiovascular:     Rate and Rhythm: Normal rate and regular rhythm.     Chest Wall: PMI is not displaced.     Pulses: Normal pulses.     Heart sounds: Normal heart sounds. No murmur. No friction rub. No gallop.   Pulmonary:     Effort: Pulmonary effort is normal. No respiratory distress.     Breath sounds: Normal breath sounds. No wheezing.  Abdominal:     General: Bowel sounds are normal. There is no  distension or abdominal bruit.     Palpations: Abdomen is soft. There is no hepatomegaly or splenomegaly.     Tenderness: There is no abdominal tenderness. There is no right CVA tenderness or left CVA tenderness.     Hernia: No hernia is present.  Musculoskeletal:     Right shoulder: Normal.     Left shoulder: He exhibits decreased range of motion, tenderness and pain. He exhibits no bony tenderness, no swelling, no effusion, no crepitus, no deformity, no laceration, no spasm, normal pulse and normal strength.     Cervical back: He exhibits decreased range of motion and tenderness.     Thoracic back: Normal.     Right upper arm: Normal.     Left upper arm: Normal.     Right lower leg: No edema.     Left lower leg: No edema.  Lymphadenopathy:     Cervical: No cervical adenopathy.  Skin:    General: Skin is warm and dry.     Capillary Refill: Capillary refill takes less than 2 seconds.     Coloration: Skin is not cyanotic, jaundiced or pale.     Findings: Erythema and lesion present. No rash.       Neurological:     General: No focal deficit present.     Mental Status: He is alert and oriented to person, place, and time.     Cranial Nerves: Cranial nerves are intact. No cranial nerve deficit.     Sensory: Sensation is intact. No sensory deficit.     Motor: Motor function is intact. No weakness.     Coordination: Coordination is intact. Coordination normal.     Gait: Gait is intact. Gait normal.     Deep Tendon Reflexes: Reflexes are normal and symmetric. Reflexes normal.  Psychiatric:        Attention and Perception: Attention and perception normal.        Mood and Affect: Mood and  affect normal.        Speech: Speech normal.        Behavior: Behavior normal. Behavior is cooperative.        Thought Content: Thought content normal.        Cognition and Memory: Cognition and memory normal.        Judgment: Judgment normal.     Results for orders placed or performed in visit on  09/22/19  Hepatic function panel  Result Value Ref Range   Total Bilirubin 0.5 0.2 - 1.2 mg/dL   Bilirubin, Direct 0.1 0.0 - 0.3 mg/dL   Alkaline Phosphatase 46 39 - 117 U/L   AST 19 0 - 37 U/L   ALT 35 0 - 53 U/L   Total Protein 7.1 6.0 - 8.3 g/dL   Albumin 5.9 (H) 3.5 - 5.2 g/dL   Skin lesion removal: Pts name, DOB, allergies, and procedure verified. Pt gave verbal consent for procedure. Punch biopsy was performe  giving margins of 2 mm on either side of the lesion. 2% lidocaine with epinephrine was used for local anesthesia, 1 mL. 3-0 PGA was used to repair the wound.  Placed 1 suture. Incision was approximated well and topical antibiotic was used and then it was covered by 4 x 4 and tape told in place. Procedure was tolerated well. Minimal blood loss during procedure.    Pertinent labs & imaging results that were available during my care of the patient were reviewed by me and considered in my medical decision making.  Assessment & Plan:  Jance was seen today for irritation of mole.  Diagnoses and all orders for this visit:  Atypical mole Atypical mole with recent changes in size and color. Removed via punch biopsy and sent for pathology. Wound care discussed. Suture removal in 5-7 days.  -     Pathology ([facility])  Other fatigue BMI 28.0-28.9,adult Diet and exercise encouraged. Will check below. Sleep hygiene discussed in detail.  -     CMP14+EGFR -     CBC with Differential/Platelet -     Thyroid Panel With TSH -     Lipid panel -     Testosterone,Free and Total  Decreased libido Will check testosterone today. -     Testosterone,Free and Total  Paresthesia of arm Ongoing and worsening. Will check below today for possible underlying causes such as electrolyte deficiencies, anemia, or B12 deficiency.  -     CMP14+EGFR -     CBC with Differential/Platelet -     Vitamin B12    Total time spent with patient 40 minutes.  Greater than 50% of encounter spent in  coordination of care/counseling.  Continue all other maintenance medications.  Follow up plan: Return in about 2 weeks (around 10/20/2019), or if symptoms worsen or fail to improve, for recheck mole removal.  Continue healthy lifestyle choices, including diet (rich in fruits, vegetables, and lean proteins, and low in salt and simple carbohydrates) and exercise (at least 30 minutes of moderate physical activity daily).  Educational handout given for mole removal and aftercare   The above assessment and management plan was discussed with the patient. The patient verbalized understanding of and has agreed to the management plan. Patient is aware to call the clinic if they develop any new symptoms or if symptoms persist or worsen. Patient is aware when to return to the clinic for a follow-up visit. Patient educated on when it is appropriate to go to the emergency department.  Monia Pouch, FNP-C [facility] [phone]

## 2019-10-06 NOTE — Patient Instructions (Signed)
Excision of Skin Lesions, Care After This sheet gives you information about how to care for yourself after your procedure. Your health care provider may also give you more specific instructions. If you have problems or questions, contact your health care provider. What can I expect after the procedure? After your procedure, it is common to have pain or discomfort at the excision site. Follow these instructions at home: Excision care  Follow instructions from your health care provider about how to take care of your excision site. Make sure you: Wash your hands with soap and water before and after you change your bandage (dressing). If soap and water are not available, use hand sanitizer. Change your dressing as told by your health care provider. Leave stitches (sutures), skin glue, or adhesive strips in place. These skin closures may need to stay in place for 2 weeks or longer. If adhesive strip edges start to loosen and curl up, you may trim the loose edges. Do not remove adhesive strips completely unless your health care provider tells you to do that. Check the excision area every day for signs of infection. Watch for: Redness, swelling, or pain. Fluid or blood. Warmth. Pus or a bad smell. Keep the site clean, dry, and protected for at least 48 hours. For bleeding, apply gentle but firm pressure to the area using a folded towel for 20 minutes. Avoid high-impact exercise and activities until the sutures are removed or the area heals. General instructions Take over-the-counter and prescription medicines only as told by your health care provider. Follow instructions from your health care provider about how to minimize scarring. Scarring should lessen over time. Avoid sun exposure until the area has healed. Use sunscreen to protect the area from the sun after it has healed. Keep all follow-up visits as told by your health care provider. This is important. Contact a health care provider if: You  have redness, swelling, or pain around your excision site. You have fluid or blood coming from your excision site. Your excision site feels warm to the touch. You have pus or a bad smell coming from your excision site. You have a fever. You have pain that does not improve in 2-3 days after your procedure. You notice skin irregularities or changes in how you feel (sensation). Summary This sheet of instructions provides you with information about caring for yourself after your procedure. Contact your health care provider if you have any problems or questions. Take over-the-counter and prescription medicines only as told by your health care provider. Change your dressing as told by your health care provider. Contact a health care provider if you have redness, swelling, pain, or other signs of infection around your excision site. Keep all follow-up visits as told by your health care provider. This is important. This information is not intended to replace advice given to you by your health care provider. Make sure you discuss any questions you have with your health care provider. Document Released: 03/26/2015 Document Revised: 05/18/2018 Document Reviewed: 05/18/2018 Elsevier Patient Education  2020 [employer] [place]. Excision of Skin Lesions Excision of a skin lesion is the removal of a section of skin by making small cuts (incisions) in the skin. Through this process, the lesion is completely removed. This procedure is often done to treat or prevent cancer or infection. It may also be done to improve cosmetic appearance. The procedure may be done to remove:  Cancerous (malignant) growths, such as basal cell carcinoma, squamous cell carcinoma, or melanoma.  Noncancerous (benign)  growths, such as a cyst or lipoma.  Growths, such as moles or skin tags, which may be removed for cosmetic reasons. Various excision or surgical techniques may be used depending on your condition, the location of the lesion,  and your overall health. Tell a health care provider about:  Any allergies you have.  All medicines you are taking, including vitamins, herbs, eye drops, creams, and over-the-counter medicines.  Any problems you or family members have had with anesthetic medicines.  Any blood disorders you have.  Any surgeries you have had.  Any medical conditions you have or have had.  Whether you are pregnant or may be pregnant. What are the risks? Generally, this is a safe procedure. However, problems may occur, including:  Bleeding.  Infection.  Scarring.  Recurrence of the cyst, lipoma, or cancer.  Changes in skin sensation or appearance, such as discoloration or swelling.  Reaction to the anesthetics.  Allergic reaction to surgical materials or ointments.  Damage to nerves, blood vessels, muscles, or other structures.  Continued pain. What happens before the procedure? Medicines Ask your health care provider about:  Changing or stopping your regular medicines. This is especially important if you are taking diabetes medicines or blood thinners.  Taking medicines such as aspirin and ibuprofen. These medicines can thin your blood. Do not take these medicines unless your health care provider tells you to take them.  Taking over-the-counter medicines, vitamins, herbs, and supplements. General instructions  You may be asked to stop smoking.  You may have an exam or testing.  Ask your health care provider what steps will be taken to help prevent infection. These may include: ? Removing hair at the surgery site. ? Washing skin with a germ-killing soap. ? Taking antibiotic medicine. What happens during the procedure?   You will be given a medicine to numb the area (local anesthetic).  Your health care provider will remove the lesions using one of the following excision techniques. ? Complete surgical excision. This procedure may be done to treat a cancerous growth or a  noncancerous cyst or lesion.  A small scalpel or scissors will be used to gently cut around and under the lesion until it is completely removed.  If bleeding occurs, it will be stopped with a device that delivers heat (electrocautery).  The edges of the wound may be stitched (sutured) together.  A bandage (dressing) will be applied.  Samples will be sent to a lab for testing. ? Excision of a cyst.  An incision will be made on the cyst.  The entire cyst will be removed through the incision.  The incision may be closed with sutures. ? Shave excision. This may be done to remove a mole or a skin tag.  A small blade or an electrically heated loop instrument will be used to shave off the lesion.  The wound is usually left to heal on its own without sutures. ? Punch excision. This may be done to remove a mole or a scar or to do a biopsy of the lesion.  A small tool that is like a cookie cutter or a hole punch is used to cut a circle shape out of the skin.  The outer edges of the skin will be sutured together.  The sample may be sent to a lab for testing. ? Mohs micrographic surgery. This is usually done to treat skin cancer. This type of excision is mostly used on the face and ears. This procedure is minimally invasive, and  it ensures the best cosmetic outcome.  A scalpel or a loop instrument will be used to remove layers of the lesion until all the abnormal or cancerous tissue has been removed.  The wound may be sutured, depending on its size.  The tissue will be checked under a microscope right away. Each of the techniques may vary among health care providers and hospitals. At the end of any of these procedures, antibiotic ointment will be applied as needed. What happens after the procedure?  Return to your normal activities as told by your health care provider. Ask your health care provider what activities are safe for you.  It is up to you to get the results of your procedure.  Ask your doctor, or the department that is doing the procedure, when your results will be ready.  Talk with your health care provider to discuss any test results, treatment options, and if necessary, the need for more tests.  Keep all follow-up visits as told by your health care provider. This is important. Summary  Excision of a skin lesion is the removal of a section of skin by making small cuts (incisions) in the skin. This procedure is often done to treat or prevent cancer and infection, or it may be done to improve cosmetic appearance.  Various excision or surgical techniques may be used depending on your condition, the location of the lesion, and your overall health.  After the procedure, talk with your health care provider to discuss any test results, treatment options, and if necessary, the need for more tests.  Keep all follow-up visits as told by your health care provider. This is important. This information is not intended to replace advice given to you by your health care provider. Make sure you discuss any questions you have with your health care provider. Document Released: 02/03/2010 Document Revised: 05/18/2018 Document Reviewed: 05/18/2018 Elsevier Patient Education  2020 [employer].

## 2019-10-07 LAB — CBC WITH DIFFERENTIAL/PLATELET
Basophils Absolute: 0 10*3/uL (ref 0.0–0.2)
Basos: 0 %
EOS (ABSOLUTE): 0.2 10*3/uL (ref 0.0–0.4)
Eos: 3 %
Hematocrit: 44.6 % (ref 37.5–51.0)
Hemoglobin: 15 g/dL (ref 13.0–17.7)
Immature Grans (Abs): 0 10*3/uL (ref 0.0–0.1)
Immature Granulocytes: 0 %
Lymphocytes Absolute: 2.2 10*3/uL (ref 0.7–3.1)
Lymphs: 32 %
MCH: 29.2 pg (ref 26.6–33.0)
MCHC: 33.6 g/dL (ref 31.5–35.7)
MCV: 87 fL (ref 79–97)
Monocytes Absolute: 0.4 10*3/uL (ref 0.1–0.9)
Monocytes: 6 %
Neutrophils Absolute: 4 10*3/uL (ref 1.4–7.0)
Neutrophils: 59 %
Platelets: 255 10*3/uL (ref 150–450)
RBC: 5.14 x10E6/uL (ref 4.14–5.80)
RDW: 12.4 % (ref 11.6–15.4)
WBC: 6.9 10*3/uL (ref 3.4–10.8)

## 2019-10-07 LAB — CMP14+EGFR
ALT: 42 IU/L (ref 0–44)
AST: 21 IU/L (ref 0–40)
Albumin/Globulin Ratio: 2 (ref 1.2–2.2)
Albumin: 4.7 g/dL (ref 4.0–5.0)
Alkaline Phosphatase: 51 IU/L (ref 39–117)
BUN/Creatinine Ratio: 15 (ref 9–20)
BUN: 18 mg/dL (ref 6–20)
Bilirubin Total: 0.2 mg/dL (ref 0.0–1.2)
CO2: 26 mmol/L (ref 20–29)
Calcium: 10 mg/dL (ref 8.7–10.2)
Chloride: 101 mmol/L (ref 96–106)
Creatinine, Ser: 1.2 mg/dL (ref 0.76–1.27)
GFR calc Af Amer: 89 mL/min/{1.73_m2} (ref >59)
GFR calc non Af Amer: 77 mL/min/{1.73_m2} (ref >59)
Globulin, Total: 2.3 g/dL (ref 1.5–4.5)
Glucose: 110 mg/dL — ABNORMAL HIGH (ref 65–99)
Potassium: 4.8 mmol/L (ref 3.5–5.2)
Sodium: 139 mmol/L (ref 134–144)
Total Protein: 7 g/dL (ref 6.0–8.5)

## 2019-10-07 LAB — TESTOSTERONE,FREE AND TOTAL
Testosterone, Free: 14.1 pg/mL (ref 8.7–25.1)
Testosterone: 299 ng/dL (ref 264–916)

## 2019-10-07 LAB — VITAMIN B12: Vitamin B-12: 668 pg/mL (ref 232–1245)

## 2019-10-07 LAB — LIPID PANEL
Chol/HDL Ratio: 6.1 ratio — ABNORMAL HIGH (ref 0.0–5.0)
Cholesterol, Total: 226 mg/dL — ABNORMAL HIGH (ref 100–199)
HDL: 37 mg/dL — ABNORMAL LOW (ref >39)
LDL Chol Calc (NIH): 163 mg/dL — ABNORMAL HIGH (ref 0–99)
Triglycerides: 141 mg/dL (ref 0–149)
VLDL Cholesterol Cal: 26 mg/dL (ref 5–40)

## 2019-10-07 LAB — THYROID PANEL WITH TSH
Free Thyroxine Index: 1.5 (ref 1.2–4.9)
T3 Uptake Ratio: 25 % (ref 24–39)
T4, Total: 6.1 ug/dL (ref 4.5–12.0)
TSH: 4.8 u[IU]/mL — ABNORMAL HIGH (ref 0.450–4.500)

## 2019-10-11 LAB — ANATOMIC PATHOLOGY REPORT: PDF Image: 0

## 2019-10-12 DIAGNOSIS — M542 Cervicalgia: Secondary | ICD-10-CM | POA: Diagnosis not present

## 2019-10-16 ENCOUNTER — Ambulatory Visit (INDEPENDENT_AMBULATORY_CARE_PROVIDER_SITE_OTHER): Payer: BC Managed Care – PPO | Admitting: Family Medicine

## 2019-10-16 ENCOUNTER — Ambulatory Visit: Payer: BC Managed Care – PPO | Admitting: Family Medicine

## 2019-10-16 ENCOUNTER — Other Ambulatory Visit: Payer: Self-pay

## 2019-10-16 ENCOUNTER — Encounter: Payer: Self-pay | Admitting: Family Medicine

## 2019-10-16 VITALS — BP 136/65 | HR 76 | Temp 98.0°F | Resp 20 | Ht 71.0 in | Wt 206.0 lb

## 2019-10-16 DIAGNOSIS — Z4802 Encounter for removal of sutures: Secondary | ICD-10-CM

## 2019-10-16 NOTE — Patient Instructions (Signed)
Wound Care, Adult Taking care of your wound properly can help to prevent pain, infection, and scarring. It can also help your wound to heal more quickly. How to care for your wound Wound care      Follow instructions from your health care provider about how to take care of your wound. Make sure you: ? Wash your hands with soap and water before you change the bandage (dressing). If soap and water are not available, use hand sanitizer. ? Change your dressing as told by your health care provider. ? Leave stitches (sutures), skin glue, or adhesive strips in place. These skin closures may need to stay in place for 2 weeks or longer. If adhesive strip edges start to loosen and curl up, you may trim the loose edges. Do not remove adhesive strips completely unless your health care provider tells you to do that.  Check your wound area every day for signs of infection. Check for: ? Redness, swelling, or pain. ? Fluid or blood. ? Warmth. ? Pus or a bad smell.  Ask your health care provider if you should clean the wound with mild soap and water. Doing this may include: ? Using a clean towel to pat the wound dry after cleaning it. Do not rub or scrub the wound. ? Applying a cream or ointment. Do this only as told by your health care provider. ? Covering the incision with a clean dressing.  Ask your health care provider when you can leave the wound uncovered.  Keep the dressing dry until your health care provider says it can be removed. Do not take baths, swim, use a hot tub, or do anything that would put the wound underwater until your health care provider approves. Ask your health care provider if you can take showers. You may only be allowed to take sponge baths. Medicines   If you were prescribed an antibiotic medicine, cream, or ointment, take or use the antibiotic as told by your health care provider. Do not stop taking or using the antibiotic even if your condition improves.  Take  over-the-counter and prescription medicines only as told by your health care provider. If you were prescribed pain medicine, take it 30 or more minutes before you do any wound care or as told by your health care provider. General instructions  Return to your normal activities as told by your health care provider. Ask your health care provider what activities are safe.  Do not scratch or pick at the wound.  Do not use any products that contain nicotine or tobacco, such as cigarettes and e-cigarettes. These may delay wound healing. If you need help quitting, ask your health care provider.  Keep all follow-up visits as told by your health care provider. This is important.  Eat a diet that includes protein, vitamin A, vitamin C, and other nutrient-rich foods to help the wound heal. ? Foods rich in protein include meat, dairy, beans, nuts, and other sources. ? Foods rich in vitamin A include carrots and dark green, leafy vegetables. ? Foods rich in vitamin C include citrus, tomatoes, and other fruits and vegetables. ? Nutrient-rich foods have protein, carbohydrates, fat, vitamins, or minerals. Eat a variety of healthy foods including vegetables, fruits, and whole grains. Contact a health care provider if:  You received a tetanus shot and you have swelling, severe pain, redness, or bleeding at the injection site.  Your pain is not controlled with medicine.  You have redness, swelling, or pain around the wound.    You have fluid or blood coming from the wound.  Your wound feels warm to the touch.  You have pus or a bad smell coming from the wound.  You have a fever or chills.  You are nauseous or you vomit.  You are dizzy. Get help right away if:  You have a red streak going away from your wound.  The edges of the wound open up and separate.  Your wound is bleeding, and the bleeding does not stop with gentle pressure.  You have a rash.  You faint.  You have trouble breathing.  Summary  Always wash your hands with soap and water before changing your bandage (dressing).  To help with healing, eat foods that are rich in protein, vitamin A, vitamin C, and other nutrients.  Check your wound every day for signs of infection. Contact your health care provider if you suspect that your wound is infected. This information is not intended to replace advice given to you by your health care provider. Make sure you discuss any questions you have with your health care provider. Document Released: 08/18/2008 Document Revised: 02/27/2019 Document Reviewed: 05/26/2016 Elsevier Patient Education  2020 [employer]  

## 2019-10-16 NOTE — Progress Notes (Signed)
Subjective:  Patient ID: Blake Jordan, male    DOB: 1981/10/11, 38 y.o.   MRN: [id number]  Patient Care Team: Dettinger, Fransisca Kaufmann, MD as PCP - General (Family Medicine)   Chief Complaint:  Suture / Staple Removal   HPI: Blake Jordan is a 38 y.o. male presenting on 10/16/2019 for Suture / Staple Removal   Pt here for suture removal. Pt had a punch skin biopsy on 10/06/2019 that was repaired with one suture.   Suture / Staple Removal The sutures were placed 7 to 10 days ago. He tried nothing since the wound repair. There has been no drainage from the wound. There is no redness present. There is no swelling present. There is no pain present.    Relevant past medical, surgical, family, and social history reviewed and updated as indicated.  Allergies and medications reviewed and updated. Date reviewed: Chart in Epic.   Past Medical History:  Diagnosis Date   Fatty liver    GERD (gastroesophageal reflux disease)    Glaucoma    potentially will have - needs eye drops   History of kidney stones    Hypercholesterolemia     Past Surgical History:  Procedure Laterality Date   CARDIOVASCULAR STRESS TEST     DENTAL RESTORATION/EXTRACTION WITH X-RAY     ESOPHAGOGASTRODUODENOSCOPY  2018    Social History   Socioeconomic History   Marital status: Married    Spouse name: Not on file   Number of children: 4   Years of education: Not on file   Highest education level: Not on file  Occupational History   Occupation: [employer] secretary  Social Needs   Financial resource strain: Not on file   Food insecurity    Worry: Not on file    Inability: Not on file   Transportation needs    Medical: Not on file    Non-medical: Not on file  Tobacco Use   Smoking status: Former Smoker    Quit date: 11/23/2013    Years since quitting: 5.8   Smokeless tobacco: Former Systems developer    Quit date: 11/24/2015  Substance and Sexual Activity   Alcohol use: No   Drug  use: No   Sexual activity: Yes    Partners: Female  Lifestyle   Physical activity    Days per week: Not on file    Minutes per session: Not on file   Stress: Not on file  Relationships   Social connections    Talks on phone: Not on file    Gets together: Not on file    Attends religious service: Not on file    Active member of club or organization: Not on file    Attends meetings of clubs or organizations: Not on file    Relationship status: Not on file   Intimate partner violence    Fear of current or ex partner: Not on file    Emotionally abused: Not on file    Physically abused: Not on file    Forced sexual activity: Not on file  Other Topics Concern   Not on file  Social History Narrative   Married, 4 children, 2 from first relationship to from current wife, she is pregnant at this time   [employer] secretary   No EtOH   Former smoker   No drug use    Outpatient Encounter Medications as of 10/16/2019  Medication Sig   baclofen (LIORESAL) 10 MG tablet Take 10 mg by mouth  2 (two) times daily as needed.   cyclobenzaprine (FLEXERIL) 5 MG tablet Take 5-10 mg by mouth at bedtime as needed.   HYDROcodone-acetaminophen (NORCO/VICODIN) 5-325 MG tablet Take 1 tablet by mouth every 6 (six) hours as needed.   latanoprost (XALATAN) 0.005 % ophthalmic solution 1 drop at bedtime.   meloxicam (MOBIC) 15 MG tablet Take 15 mg by mouth daily as needed.   methocarbamol (ROBAXIN) 500 MG tablet Take 500 mg by mouth every 6 (six) hours as needed.   metroNIDAZOLE (METROCREAM) 0.75 % cream Apply topically 2 (two) times daily.   polyethylene glycol (MIRALAX / GLYCOLAX) packet Take 17 g by mouth daily as needed. hasnt used in a week    No facility-administered encounter medications on file as of 10/16/2019.     No Known Allergies  Review of Systems  Constitutional: Negative for activity change, appetite change, chills, fatigue and fever.  HENT: Negative.   Eyes: Negative.    Respiratory: Negative for cough, chest tightness and shortness of breath.   Cardiovascular: Negative for chest pain, palpitations and leg swelling.  Gastrointestinal: Negative for blood in stool, constipation, diarrhea, nausea and vomiting.  Endocrine: Negative.   Genitourinary: Negative for dysuria, frequency and urgency.  Musculoskeletal: Negative for arthralgias and myalgias.  Skin: Negative.   Allergic/Immunologic: Negative.   Neurological: Negative for dizziness and headaches.  Hematological: Negative.   Psychiatric/Behavioral: Negative for confusion, hallucinations, sleep disturbance and suicidal ideas.  All other systems reviewed and are negative.       Objective:  BP 136/65    Pulse 76    Temp 98 F (36.7 C)    Resp 20    Ht 5' 11"  (1.803 m)    Wt 206 lb (93.4 kg)    SpO2 98%    BMI 28.73 kg/m    Wt Readings from Last 3 Encounters:  10/16/19 206 lb (93.4 kg)  10/06/19 206 lb (93.4 kg)  04/05/19 223 lb (101.2 kg)    Physical Exam Vitals signs and nursing note reviewed.  Constitutional:      General: He is not in acute distress.    Appearance: Normal appearance. He is not ill-appearing, toxic-appearing or diaphoretic.  HENT:     Head: Normocephalic and atraumatic.  Eyes:     Pupils: Pupils are equal, round, and reactive to light.  Cardiovascular:     Rate and Rhythm: Normal rate.  Skin:    General: Skin is warm and dry.     Capillary Refill: Capillary refill takes less than 2 seconds.     Findings: Wound present.       Neurological:     General: No focal deficit present.     Mental Status: He is alert and oriented to person, place, and time.      Results for orders placed or performed in visit on 10/06/19  CMP14+EGFR  Result Value Ref Range   Glucose 110 (H) 65 - 99 mg/dL   BUN 18 6 - 20 mg/dL   Creatinine, Ser 1.20 0.76 - 1.27 mg/dL   GFR calc non Af Amer 77 >59 mL/min/1.73   GFR calc Af Amer 89 >59 mL/min/1.73   BUN/Creatinine Ratio 15 9 - 20    Sodium 139 134 - 144 mmol/L   Potassium 4.8 3.5 - 5.2 mmol/L   Chloride 101 96 - 106 mmol/L   CO2 26 20 - 29 mmol/L   Calcium 10.0 8.7 - 10.2 mg/dL   Total Protein 7.0 6.0 - 8.5 g/dL  Albumin 4.7 4.0 - 5.0 g/dL   Globulin, Total 2.3 1.5 - 4.5 g/dL   Albumin/Globulin Ratio 2.0 1.2 - 2.2   Bilirubin Total 0.2 0.0 - 1.2 mg/dL   Alkaline Phosphatase 51 39 - 117 IU/L   AST 21 0 - 40 IU/L   ALT 42 0 - 44 IU/L  CBC with Differential/Platelet  Result Value Ref Range   WBC 6.9 3.4 - 10.8 x10E3/uL   RBC 5.14 4.14 - 5.80 x10E6/uL   Hemoglobin 15.0 13.0 - 17.7 g/dL   Hematocrit 44.6 37.5 - 51.0 %   MCV 87 79 - 97 fL   MCH 29.2 26.6 - 33.0 pg   MCHC 33.6 31.5 - 35.7 g/dL   RDW 12.4 11.6 - 15.4 %   Platelets 255 150 - 450 x10E3/uL   Neutrophils 59 Not Estab. %   Lymphs 32 Not Estab. %   Monocytes 6 Not Estab. %   Eos 3 Not Estab. %   Basos 0 Not Estab. %   Neutrophils Absolute 4.0 1.4 - 7.0 x10E3/uL   Lymphocytes Absolute 2.2 0.7 - 3.1 x10E3/uL   Monocytes Absolute 0.4 0.1 - 0.9 x10E3/uL   EOS (ABSOLUTE) 0.2 0.0 - 0.4 x10E3/uL   Basophils Absolute 0.0 0.0 - 0.2 x10E3/uL   Immature Granulocytes 0 Not Estab. %   Immature Grans (Abs) 0.0 0.0 - 0.1 x10E3/uL  Thyroid Panel With TSH  Result Value Ref Range   TSH 4.800 (H) 0.450 - 4.500 uIU/mL   T4, Total 6.1 4.5 - 12.0 ug/dL   T3 Uptake Ratio 25 24 - 39 %   Free Thyroxine Index 1.5 1.2 - 4.9  Lipid panel  Result Value Ref Range   Cholesterol, Total 226 (H) 100 - 199 mg/dL   Triglycerides 141 0 - 149 mg/dL   HDL 37 (L) >39 mg/dL   VLDL Cholesterol Cal 26 5 - 40 mg/dL   LDL Chol Calc (NIH) 163 (H) 0 - 99 mg/dL   Chol/HDL Ratio 6.1 (H) 0.0 - 5.0 ratio  Testosterone,Free and Total  Result Value Ref Range   Testosterone 299 264 - 916 ng/dL   Testosterone, Free 14.1 8.7 - 25.1 pg/mL  Vitamin B12  Result Value Ref Range   Vitamin B-12 668 232 - 1,245 pg/mL  Pathology ([facility])  Result Value Ref Range   Diagnosis synopsis: Comment     Specimen: Comment    Diagnosis: Comment    Microscopic description: Comment    Gross description: Comment    Electronically signed by: Comment    CPT code(s): Comment    CPT Disclaimer: Comment    Clinician provided ICD: D22.9    Pathologist provided ICD: D18.01    PDF Image .      Suture Removal  Date/Time: 10/16/2019 2:49 PM Performed by: Baruch Gouty, FNP Authorized by: Baruch Gouty, FNP  Body area: trunk Location details: back Wound Appearance: clean Sutures Removed: 1 Post-removal: dressing applied Facility: sutures placed in this facility Patient tolerance: patient tolerated the procedure well with no immediate complications     Pertinent labs & imaging results that were available during my care of the patient were reviewed by me and considered in my medical decision making.  Assessment & Plan:  Ryder was seen today for suture / staple removal.  Diagnoses and all orders for this visit:  Visit for suture removal Wound healed well with good approximated edges. Suture removed. Wound care discussed.  -     Suture Removal  Continue all other maintenance medications.  Follow up plan: Return if symptoms worsen or fail to improve.  Continue healthy lifestyle choices, including diet (rich in fruits, vegetables, and lean proteins, and low in salt and simple carbohydrates) and exercise (at least 30 minutes of moderate physical activity daily).  Educational handout given for wound care  The above assessment and management plan was discussed with the patient. The patient verbalized understanding of and has agreed to the management plan. Patient is aware to call the clinic if they develop any new symptoms or if symptoms persist or worsen. Patient is aware when to return to the clinic for a follow-up visit. Patient educated on when it is appropriate to go to the emergency department.   Monia Pouch, FNP-C [facility] [phone]

## 2019-10-23 ENCOUNTER — Ambulatory Visit: Payer: BC Managed Care – PPO | Admitting: Family Medicine

## 2019-10-23 ENCOUNTER — Other Ambulatory Visit: Payer: Self-pay

## 2019-10-23 ENCOUNTER — Encounter: Payer: Self-pay | Admitting: Family Medicine

## 2019-10-23 VITALS — BP 123/73 | HR 68 | Temp 98.4°F | Ht 71.0 in | Wt 208.0 lb

## 2019-10-23 DIAGNOSIS — B350 Tinea barbae and tinea capitis: Secondary | ICD-10-CM | POA: Diagnosis not present

## 2019-10-23 DIAGNOSIS — R7989 Other specified abnormal findings of blood chemistry: Secondary | ICD-10-CM

## 2019-10-23 NOTE — Progress Notes (Signed)
BP 123/73   Pulse 68   Temp 98.4 F (36.9 C) (Temporal)   Ht 5\' 11"  (1.803 m)   Wt 208 lb (94.3 kg)   SpO2 98%   BMI 29.01 kg/m    Subjective:   Patient ID: Blake Jordan, male    DOB: [birth year], 38 y.o.   MRN: [id number]  HPI: Blake Jordan is a 38 y.o. male presenting on 10/23/2019 for discuss testosterone (Patient states that he has been also feeling like his heart is racing at different times.)   HPI Patient has been having flushing the palpitations at times and hot flashes that have been increasingly occurring over the past year.  Is wondering whether he has some hormonal imbalance and had a bunch of labs tested a couple weeks ago.  According to the labs he was borderline on the TSH which is concerning for possible thyroid disease.  He also has some spots on his face that are flaky and scaly on both cheeks that he was told by dermatologist possibly could be from dandruff.  He would like a referral to them.  Relevant past medical, surgical, family and social history reviewed and updated as indicated. Interim medical history since our last visit reviewed. Allergies and medications reviewed and updated.  Review of Systems  Constitutional: Positive for fatigue. Negative for chills and fever.  Respiratory: Negative for shortness of breath and wheezing.   Cardiovascular: Positive for palpitations. Negative for chest pain and leg swelling.  Endocrine: Positive for heat intolerance.  Musculoskeletal: Negative for back pain and gait problem.  Skin: Negative for rash.  All other systems reviewed and are negative.   Per HPI unless specifically indicated above   Allergies as of 10/23/2019   No Known Allergies     Medication List       Accurate as of October 23, 2019  3:45 PM. If you have any questions, ask your nurse or doctor.        STOP taking these medications   meloxicam 15 MG tablet Commonly known as: MOBIC Stopped by: Fransisca Kaufmann Dettinger, MD     TAKE  these medications   baclofen 10 MG tablet Commonly known as: LIORESAL Take 10 mg by mouth 2 (two) times daily as needed.   cyclobenzaprine 5 MG tablet Commonly known as: FLEXERIL Take 5-10 mg by mouth at bedtime as needed.   HYDROcodone-acetaminophen 5-325 MG tablet Commonly known as: NORCO/VICODIN Take 1 tablet by mouth every 6 (six) hours as needed.   latanoprost 0.005 % ophthalmic solution Commonly known as: XALATAN 1 drop at bedtime.   methocarbamol 500 MG tablet Commonly known as: ROBAXIN Take 500 mg by mouth every 6 (six) hours as needed.   metroNIDAZOLE 0.75 % cream Commonly known as: METROCREAM Apply topically 2 (two) times daily.   polyethylene glycol 17 g packet Commonly known as: MIRALAX / GLYCOLAX Take 17 g by mouth daily as needed. hasnt used in a week        Objective:   BP 123/73   Pulse 68   Temp 98.4 F (36.9 C) (Temporal)   Ht 5\' 11"  (1.803 m)   Wt 208 lb (94.3 kg)   SpO2 98%   BMI 29.01 kg/m   Wt Readings from Last 3 Encounters:  10/23/19 208 lb (94.3 kg)  10/16/19 206 lb (93.4 kg)  10/06/19 206 lb (93.4 kg)    Physical Exam Vitals signs and nursing note reviewed.  Constitutional:      General: He is not  in acute distress.    Appearance: He is well-developed. He is not diaphoretic.  Eyes:     General: No scleral icterus.    Conjunctiva/sclera: Conjunctivae normal.  Neck:     Musculoskeletal: Neck supple.     Thyroid: No thyromegaly.  Cardiovascular:     Rate and Rhythm: Normal rate and regular rhythm.     Heart sounds: Normal heart sounds. No murmur.  Pulmonary:     Effort: Pulmonary effort is normal. No respiratory distress.     Breath sounds: Normal breath sounds. No wheezing.  Musculoskeletal: Normal range of motion.  Lymphadenopathy:     Cervical: No cervical adenopathy.  Skin:    General: Skin is warm and dry.     Findings: Rash (2 small flaky patches on both cheeks, very small, small amount of flakiness in the scalp and  under his beard.) present.  Neurological:     Mental Status: He is alert and oriented to person, place, and time.     Coordination: Coordination normal.  Psychiatric:        Behavior: Behavior normal.       Assessment & Plan:   Problem List Items Addressed This Visit    None    Visit Diagnoses    High serum thyroid stimulating hormone (TSH)    -  Primary   Relevant Orders   Thyroid Panel With TSH   Tinea capitis       Relevant Orders   Ambulatory referral to Dermatology      TSH was 4.8, normal 2 years ago, will check, symptoms could fit a thyroid hormone abnormality especially if it was fluctuating. Follow up plan: Return in about 3 months (around 01/21/2020), or if symptoms worsen or fail to improve, for Recheck thyroid.  Counseling provided for all of the vaccine components Orders Placed This Encounter  Procedures  . Thyroid Panel With TSH  . Ambulatory referral to Dermatology    Caryl Pina, MD [facility] 10/23/2019, 3:45 PM

## 2019-10-24 LAB — THYROID PANEL WITH TSH
Free Thyroxine Index: 1.3 (ref 1.2–4.9)
T3 Uptake Ratio: 25 % (ref 24–39)
T4, Total: 5.2 ug/dL (ref 4.5–12.0)
TSH: 3.29 u[IU]/mL (ref 0.450–4.500)

## 2019-10-30 ENCOUNTER — Other Ambulatory Visit: Payer: Self-pay

## 2019-10-30 ENCOUNTER — Encounter: Payer: Self-pay | Admitting: Neurology

## 2019-10-30 ENCOUNTER — Ambulatory Visit: Payer: BC Managed Care – PPO | Admitting: Neurology

## 2019-10-30 ENCOUNTER — Telehealth: Payer: Self-pay

## 2019-10-30 VITALS — BP 127/78 | HR 66 | Temp 97.4°F | Ht 70.0 in | Wt 211.0 lb

## 2019-10-30 DIAGNOSIS — M79602 Pain in left arm: Secondary | ICD-10-CM | POA: Diagnosis not present

## 2019-10-30 NOTE — Telephone Encounter (Signed)
-----   Message from Kathrynn Ducking, MD sent at 10/30/2019  4:02 PM EST ----- Please contact [facility] and get MRI brain report faxed to our office that was done in the last several months.  Thank you.

## 2019-10-30 NOTE — Progress Notes (Signed)
Reason for visit: Neck and shoulder discomfort, facial numbness  Referring physician: Dr. Bronson Jordan is a 38 y.o. male  History of present illness:  [name] is a 38 year old right-handed white male with a history of an injury that occurred while lifting weights 2 years ago.  The patient had sudden onset of discomfort in the left neck and shoulder and upper chest area and in the medial scapular area.  The patient was concerned he was having a heart attack and he went to the emergency room for an evaluation.  He was told that he did not have any heart issues.  The patient apparently had a full cardiac work-up.  He has had persistent symptoms since that time, there may be some undulation in severity of the discomfort, but he will have some pain in the shoulder blade area, chest, and some discomfort going down the left arm to the elbow primarily.  He at times will have a heavy feeling, he denies any severe neck stiffness, he denies any increase in symptoms with turning the head from one side to the next.  He occasionally will have some numbness and tingling in the left face, he has had MRI of the brain done at [facility] and was told that this was unremarkable.  He is also had MRI of the cervical spine done that did not show evidence of nerve root impingement on the left.  He does have occasional headaches that he does not relate to the neck and shoulder issues.  He may have about 1 headache a week.  The patient does also have some chronic low back pain and may have some occasional left leg numbness associated with this at times.  He has been placed on baclofen, methocarbamol, and Flexeril but he does not take these medications on a regular basis.  He is now in physical therapy over the last 2 months with some mild benefit.  He continues to work as a Development worker, community.  He denies any weakness of the extremities or any balance changes or difficulty controlling the bowels or  the bladder.  He is sent to this office for an evaluation.  Past Medical History:  Diagnosis Date   Fatty liver    GERD (gastroesophageal reflux disease)    Glaucoma    potentially will have - needs eye drops   History of kidney stones    Hypercholesterolemia     Past Surgical History:  Procedure Laterality Date   CARDIOVASCULAR STRESS TEST     DENTAL RESTORATION/EXTRACTION WITH X-RAY     ESOPHAGOGASTRODUODENOSCOPY  2018    Family History  Problem Relation Age of Onset   Pancreatic cancer Mother    Heart disease Father    COPD Father    Hypertension Father    Heart disease Brother    Diabetes Maternal Grandmother    Cirrhosis Maternal Aunt    Colon cancer Neg Hx    Esophageal cancer Neg Hx    Stomach cancer Neg Hx     Social history:  reports that he quit smoking about 5 years ago. He quit smokeless tobacco use about 3 years ago. He reports that he does not drink alcohol or use drugs.  Medications:  Prior to Admission medications   Medication Sig Start Date End Date Taking? Authorizing Provider  HYDROcodone-acetaminophen (NORCO/VICODIN) 5-325 MG tablet Take 1 tablet by mouth every 6 (six) hours as needed. 05/18/19  Yes [provider]  latanoprost (XALATAN) 0.005 %  ophthalmic solution 1 drop at bedtime.   Yes [provider]  metroNIDAZOLE (METROCREAM) 0.75 % cream Apply topically 2 (two) times daily. 12/15/17  Yes Timmothy Euler, MD  polyethylene glycol (MIRALAX / GLYCOLAX) packet Take 17 g by mouth daily as needed. hasnt used in a week    Yes [provider]  baclofen (LIORESAL) 10 MG tablet Take 10 mg by mouth 2 (two) times daily as needed. 05/18/19   [provider]  cyclobenzaprine (FLEXERIL) 5 MG tablet Take 5-10 mg by mouth at bedtime as needed. 06/30/19   [provider]  methocarbamol (ROBAXIN) 500 MG tablet Take 500 mg by mouth every 6 (six) hours as needed. 06/30/19   [provider]     No  Known Allergies  ROS:  Out of a complete 14 system review of symptoms, the patient complains only of the following symptoms, and all other reviewed systems are negative.  Left neck, shoulder, arm discomfort  Blood pressure 127/78, pulse 66, temperature (!) 97.4 F (36.3 C), temperature source Temporal, height 5\' 10"  (1.778 m), weight 211 lb (95.7 kg).  Physical Exam  General: The patient is alert and cooperative at the time of the examination.  Eyes: Pupils are equal, round, and reactive to light. Discs are flat bilaterally.  Neck: The neck is supple, no carotid bruits are noted.  Respiratory: The respiratory examination is clear.  Cardiovascular: The cardiovascular examination reveals a regular rate and rhythm, no obvious murmurs or rubs are noted.  Neuromuscular: Range move the cervical spine is full.  Skin: Extremities are without significant edema.  Neurologic Exam  Mental status: The patient is alert and oriented x 3 at the time of the examination. The patient has apparent normal recent and remote memory, with an apparently normal attention span and concentration ability.  Cranial nerves: Facial symmetry is present. There is good sensation of the face to pinprick and soft touch bilaterally. The strength of the facial muscles and the muscles to head turning and shoulder shrug are normal bilaterally. Speech is well enunciated, no aphasia or dysarthria is noted. Extraocular movements are full. Visual fields are full. The tongue is midline, and the patient has symmetric elevation of the soft palate. No obvious hearing deficits are noted.  Motor: The motor testing reveals 5 over 5 strength of all 4 extremities. Good symmetric motor tone is noted throughout.  Sensory: Sensory testing is intact to pinprick, soft touch, vibration sensation, and position sense on all 4 extremities. No evidence of extinction is noted.  Coordination: Cerebellar testing reveals good finger-nose-finger  and heel-to-shin bilaterally.  Gait and station: Gait is normal. Tandem gait is normal. Romberg is negative. No drift is seen.  Reflexes: Deep tendon reflexes are symmetric and normal bilaterally. Toes are downgoing bilaterally.   Assessment/Plan:  1.  Subjective discomfort, sensory alteration of left shoulder, upper chest, left upper arm  The clinical examination is completely normal.  The patient had a sudden onset problem while weightlifting suggesting a neuromuscular type injury.  He has had chronic persistent problems since onset.  The patient has been set up for EMG nerve conduction study, we will do nerve conductions of both arms and EMG on the left arm.  If abnormalities are seen suggestive of a chronic cervical radiculopathy, a cervical myelogram with CT to follow-up will be done.  My suspicion is that the work-up will be completely normal.  Ongoing neuromuscular therapy and medications are indicated.  Jill Alexanders MD 10/30/2019 8:43 AM  [facility] [address] [place], [place] [address]  Phone [phone] Fax [phone]

## 2019-10-31 ENCOUNTER — Telehealth: Payer: Self-pay | Admitting: Neurology

## 2019-10-31 DIAGNOSIS — L219 Seborrheic dermatitis, unspecified: Secondary | ICD-10-CM | POA: Diagnosis not present

## 2019-10-31 DIAGNOSIS — L245 Irritant contact dermatitis due to other chemical products: Secondary | ICD-10-CM | POA: Diagnosis not present

## 2019-10-31 NOTE — Telephone Encounter (Signed)
Request report of MRI of the brain done at [facility], apparently the patient did not have MRI, he had a CT scan of the brain that was done on 20 December 2018.  CT of the head was unremarkable.

## 2019-11-22 ENCOUNTER — Telehealth: Payer: Self-pay | Admitting: *Deleted

## 2019-11-22 NOTE — Telephone Encounter (Signed)
Spoke with patient and informed him his NCS next Mon had to be canceled; MD will be out. I advised he'll get a call next week to reschedule. He stated a VM may be left if he doesn't answer,  verbalized understanding, appreciation.

## 2019-11-27 ENCOUNTER — Encounter: Payer: BC Managed Care – PPO | Admitting: Neurology

## 2019-12-04 NOTE — Telephone Encounter (Signed)
LVM advising pt that Dr. Jannifer Franklin has openings for NCV/EMG's this week 1/11-1/14

## 2019-12-07 DIAGNOSIS — L219 Seborrheic dermatitis, unspecified: Secondary | ICD-10-CM | POA: Diagnosis not present

## 2019-12-07 DIAGNOSIS — L245 Irritant contact dermatitis due to other chemical products: Secondary | ICD-10-CM | POA: Diagnosis not present

## 2019-12-12 DIAGNOSIS — M542 Cervicalgia: Secondary | ICD-10-CM | POA: Diagnosis not present

## 2019-12-19 ENCOUNTER — Telehealth: Payer: Self-pay | Admitting: *Deleted

## 2019-12-19 ENCOUNTER — Other Ambulatory Visit: Payer: Self-pay | Admitting: *Deleted

## 2019-12-19 ENCOUNTER — Other Ambulatory Visit (INDEPENDENT_AMBULATORY_CARE_PROVIDER_SITE_OTHER): Payer: Self-pay

## 2019-12-19 DIAGNOSIS — K760 Fatty (change of) liver, not elsewhere classified: Secondary | ICD-10-CM

## 2019-12-19 LAB — HEPATIC FUNCTION PANEL
ALT: 56 U/L — ABNORMAL HIGH (ref 0–53)
AST: 27 U/L (ref 0–37)
Albumin: 4.7 g/dL (ref 3.5–5.2)
Alkaline Phosphatase: 40 U/L (ref 39–117)
Bilirubin, Direct: 0.1 mg/dL (ref 0.0–0.3)
Total Bilirubin: 0.4 mg/dL (ref 0.2–1.2)
Total Protein: 7.2 g/dL (ref 6.0–8.3)

## 2019-12-19 NOTE — Telephone Encounter (Signed)
Below: Dr. Carlean Purl, results note from 09/22/2019 [place]  The liver tests are back to normal. Good work! The elevated albumin is unusual but I do not think it is a problem. Are you using any protein supplements?  Let us recheck Liver tests in first week of January - I ordered them so just come in then. -----------  Orders placed for HFP test that was deleted.

## 2019-12-20 ENCOUNTER — Telehealth: Payer: Self-pay | Admitting: Family Medicine

## 2019-12-20 NOTE — Telephone Encounter (Signed)
REFERRAL REQUEST Telephone Note  What type of referral do you need? Neurosurgeon - Is having lower back pain in the mornings when he wakes and when he stands. Wants MRI.  Have you been seen at our office for this problem? No (Advise that they will likely need an appointment with their PCP before a referral can be done)  Is there a particular doctor or location that you prefer? Don't matter. Whatever Dettinger thinks  Patient notified that referrals can take up to a week or longer to process. If they haven't heard anything within a week they should call back and speak with the referral department.

## 2019-12-20 NOTE — Telephone Encounter (Signed)
Appointment scheduled.

## 2019-12-21 ENCOUNTER — Ambulatory Visit (INDEPENDENT_AMBULATORY_CARE_PROVIDER_SITE_OTHER): Payer: BC Managed Care – PPO | Admitting: Family Medicine

## 2019-12-21 ENCOUNTER — Ambulatory Visit (INDEPENDENT_AMBULATORY_CARE_PROVIDER_SITE_OTHER): Payer: BC Managed Care – PPO

## 2019-12-21 ENCOUNTER — Other Ambulatory Visit: Payer: Self-pay

## 2019-12-21 ENCOUNTER — Encounter: Payer: Self-pay | Admitting: Family Medicine

## 2019-12-21 VITALS — BP 133/82 | HR 84 | Temp 98.2°F | Ht 70.0 in | Wt 216.4 lb

## 2019-12-21 DIAGNOSIS — S39012A Strain of muscle, fascia and tendon of lower back, initial encounter: Secondary | ICD-10-CM

## 2019-12-21 DIAGNOSIS — M545 Low back pain: Secondary | ICD-10-CM | POA: Diagnosis not present

## 2019-12-21 NOTE — Patient Instructions (Signed)

## 2019-12-21 NOTE — Progress Notes (Signed)
BP 133/82   Pulse 84   Temp 98.2 F (36.8 C) (Temporal)   Ht 5\' 10"  (1.778 m)   Wt 216 lb 6.4 oz (98.2 kg)   SpO2 95%   BMI 31.05 kg/m    Subjective:   Patient ID: Blake Jordan, male    DOB: [birth year], 39 y.o.   MRN: [id number]  HPI: Soul Companion is a 39 y.o. male presenting on 12/21/2019 for Back Pain (Patient states that it has been going on for years but has been getting worse )   HPI Patient is coming in with low back strain, he says is been going on for years but just over the past 6 months has become more of an everyday thing, he is visibly worse in the mornings when he wakes up and worse with bending forward to pick something up and worse with prolonged standing. He says when he moves around it loosens up and it feels better throughout the day. Says it does hurt slightly more on the left side than the right but it does hurt on both, he thinks maybe every now and then he gets some numbness in his feet where they feel tingly but is not a consistent thing. He is use Tylenol and some hydrocodone that he has had recently.  Relevant past medical, surgical, family and social history reviewed and updated as indicated. Interim medical history since our last visit reviewed. Allergies and medications reviewed and updated.  Review of Systems  Constitutional: Negative for chills and fever.  Eyes: Negative for visual disturbance.  Respiratory: Negative for shortness of breath and wheezing.   Cardiovascular: Negative for chest pain and leg swelling.  Musculoskeletal: Positive for back pain and myalgias. Negative for gait problem.  Skin: Negative for rash.  Neurological: Negative for dizziness, weakness, light-headedness and numbness.  All other systems reviewed and are negative.   Per HPI unless specifically indicated above   Allergies as of 12/21/2019   No Known Allergies     Medication List       Accurate as of December 21, 2019 10:31 AM. If you have any questions, ask  your nurse or doctor.        baclofen 10 MG tablet Commonly known as: LIORESAL Take 10 mg by mouth 2 (two) times daily as needed.   cyclobenzaprine 5 MG tablet Commonly known as: FLEXERIL Take 5-10 mg by mouth at bedtime as needed.   HYDROcodone-acetaminophen 5-325 MG tablet Commonly known as: NORCO/VICODIN Take 1 tablet by mouth every 6 (six) hours as needed.   latanoprost 0.005 % ophthalmic solution Commonly known as: XALATAN 1 drop at bedtime.   methocarbamol 500 MG tablet Commonly known as: ROBAXIN Take 500 mg by mouth every 6 (six) hours as needed.   metroNIDAZOLE 0.75 % cream Commonly known as: METROCREAM Apply topically 2 (two) times daily.   polyethylene glycol 17 g packet Commonly known as: MIRALAX / GLYCOLAX Take 17 g by mouth daily as needed. hasnt used in a week        Objective:   BP 133/82   Pulse 84   Temp 98.2 F (36.8 C) (Temporal)   Ht 5\' 10"  (1.778 m)   Wt 216 lb 6.4 oz (98.2 kg)   SpO2 95%   BMI 31.05 kg/m   Wt Readings from Last 3 Encounters:  12/21/19 216 lb 6.4 oz (98.2 kg)  10/30/19 211 lb (95.7 kg)  10/23/19 208 lb (94.3 kg)    Physical Exam Vitals and nursing note  reviewed.  Constitutional:      General: He is not in acute distress.    Appearance: He is well-developed. He is not diaphoretic.  Eyes:     General: No scleral icterus.    Conjunctiva/sclera: Conjunctivae normal.  Neck:     Thyroid: No thyromegaly.  Musculoskeletal:        General: Normal range of motion.     Lumbar back: No swelling, deformity, tenderness or bony tenderness (No tenderness to palpation, pain with flexion and rotation about the back mostly in the left paraspinal region). Normal range of motion. Negative right straight leg raise test and negative left straight leg raise test. No scoliosis.  Skin:    General: Skin is warm and dry.     Findings: No rash.  Neurological:     Mental Status: He is alert and oriented to person, place, and time.      Coordination: Coordination normal.  Psychiatric:        Behavior: Behavior normal.     Lumbar x-ray, await final read from radiology  Assessment & Plan:   Problem List Items Addressed This Visit    None    Visit Diagnoses    Lumbar strain, initial encounter    -  Primary   Relevant Orders   DG Lumbar Spine 2-3 Views    Patient says he already has muscle relaxers including cyclobenzaprine and baclofen and he will start getting back on using them now, he has not been using them recently, he had them before for shoulder injury.  Follow up plan: Return in about 4 weeks (around 01/18/2020), or if symptoms worsen or fail to improve, for Thyroid check.  Counseling provided for all of the vaccine components No orders of the defined types were placed in this encounter.   Caryl Pina, MD [facility] 12/21/2019, 10:31 AM

## 2020-01-09 DIAGNOSIS — M542 Cervicalgia: Secondary | ICD-10-CM | POA: Diagnosis not present

## 2020-01-12 ENCOUNTER — Ambulatory Visit: Payer: Self-pay | Admitting: Internal Medicine

## 2020-01-15 ENCOUNTER — Telehealth: Payer: Self-pay | Admitting: Family Medicine

## 2020-01-15 DIAGNOSIS — R7989 Other specified abnormal findings of blood chemistry: Secondary | ICD-10-CM

## 2020-01-15 DIAGNOSIS — R5383 Other fatigue: Secondary | ICD-10-CM

## 2020-01-15 NOTE — Telephone Encounter (Signed)
He says his right foot has a burning sensation, not to touch but feels like on fire on and off. He wants to know if this can be checked also at his referral??

## 2020-01-15 NOTE — Telephone Encounter (Signed)
The dermatologist would not necessarily look at this with the neurologist that we did a referral to this last August is who we sent him to for this particular issue, he should go see the neurologist.

## 2020-01-15 NOTE — Telephone Encounter (Signed)
Pt is going to call his former neurologist

## 2020-01-15 NOTE — Telephone Encounter (Signed)
lmtcb

## 2020-01-16 ENCOUNTER — Other Ambulatory Visit: Payer: BC Managed Care – PPO

## 2020-01-16 ENCOUNTER — Other Ambulatory Visit: Payer: Self-pay

## 2020-01-16 DIAGNOSIS — R5383 Other fatigue: Secondary | ICD-10-CM | POA: Diagnosis not present

## 2020-01-16 DIAGNOSIS — R7989 Other specified abnormal findings of blood chemistry: Secondary | ICD-10-CM | POA: Diagnosis not present

## 2020-01-17 LAB — TESTOSTERONE,FREE AND TOTAL
Testosterone, Free: 13.5 pg/mL (ref 8.7–25.1)
Testosterone: 264 ng/dL (ref 264–916)

## 2020-01-17 LAB — TSH: TSH: 5.86 u[IU]/mL — ABNORMAL HIGH (ref 0.450–4.500)

## 2020-01-18 ENCOUNTER — Telehealth: Payer: Self-pay | Admitting: Family Medicine

## 2020-01-18 DIAGNOSIS — K760 Fatty (change of) liver, not elsewhere classified: Secondary | ICD-10-CM

## 2020-01-18 DIAGNOSIS — R7989 Other specified abnormal findings of blood chemistry: Secondary | ICD-10-CM

## 2020-01-18 DIAGNOSIS — Z13220 Encounter for screening for lipoid disorders: Secondary | ICD-10-CM

## 2020-01-18 DIAGNOSIS — Z131 Encounter for screening for diabetes mellitus: Secondary | ICD-10-CM

## 2020-01-18 NOTE — Telephone Encounter (Signed)
I placed orders for the patient to come in prior to the visit

## 2020-01-19 NOTE — Telephone Encounter (Signed)
Patient aware and verbalized understanding. °

## 2020-02-07 DIAGNOSIS — M542 Cervicalgia: Secondary | ICD-10-CM | POA: Diagnosis not present

## 2020-02-13 ENCOUNTER — Other Ambulatory Visit: Payer: Self-pay

## 2020-02-13 ENCOUNTER — Encounter: Payer: Self-pay | Admitting: Internal Medicine

## 2020-02-13 ENCOUNTER — Ambulatory Visit: Payer: BC Managed Care – PPO | Admitting: Internal Medicine

## 2020-02-13 VITALS — BP 140/92 | HR 84 | Temp 97.8°F | Ht 71.0 in | Wt 220.0 lb

## 2020-02-13 DIAGNOSIS — R1013 Epigastric pain: Secondary | ICD-10-CM | POA: Diagnosis not present

## 2020-02-13 DIAGNOSIS — K760 Fatty (change of) liver, not elsewhere classified: Secondary | ICD-10-CM

## 2020-02-13 DIAGNOSIS — R1011 Right upper quadrant pain: Secondary | ICD-10-CM | POA: Diagnosis not present

## 2020-02-13 DIAGNOSIS — M6208 Separation of muscle (nontraumatic), other site: Secondary | ICD-10-CM | POA: Diagnosis not present

## 2020-02-13 DIAGNOSIS — E669 Obesity, unspecified: Secondary | ICD-10-CM

## 2020-02-13 DIAGNOSIS — H401131 Primary open-angle glaucoma, bilateral, mild stage: Secondary | ICD-10-CM | POA: Diagnosis not present

## 2020-02-13 NOTE — Progress Notes (Signed)
   Blake Jordan 39 y.o. 1981-04-08 [id number]  Assessment & Plan:   Encounter Diagnoses  Name Primary?  . Abdominal pain, epigastric Yes  . RUQ pain   . NAFLD (nonalcoholic fatty liver disease)   . Diastasis recti   . Obesity (BMI 30-39.9)     Evaluate pain with abdominal ultrasound.  Some of this could be musculoskeletal he is not having problems right now.  Fatty liver disease can cause pain as well.  He has plans to have recheck LFTs with Dr. Warrick Parisian next month.  Continue to follow those though as long as they do not go significantly high there is not much different to do other than weight loss which we have talked about and I have given him some advice about going low-carb and some websites to use (see after visit summary).  Regarding his diastases recti he is reassured.  Weight loss should help this.  Reducing abdominal fat especially.  Some core type exercises are recommended through a resource from up-to-date.  He was reassured again.  He will eliminate milk products as much as possible to see if that improves his gas and flatulence issues  Follow-up annually sooner as needed  CC: Dettinger, Fransisca Kaufmann, MD  Subjective:   Chief Complaint: Abdominal pain, gas fatty liver  HPI The patient is here for follow-up, he comes intermittently for his fatty liver and abdominal pain issues.  Last visit May 2020.  He is having some epigastric and right upper pain and tenderness intermittently as well as some gas and bloating at times.  It seems like milk products aggravate this he wants follow-up of his fatty liver as well. Knows that he could lose some weight and belly fat again.  He has done it in the past.  Concerned about his diastases recti. Lab Results  Component Value Date   ALT 56 (H) 12/19/2019   AST 27 12/19/2019   ALKPHOS 40 12/19/2019   BILITOT 0.4 12/19/2019    No Known Allergies Current Meds  Medication Sig  . latanoprost (XALATAN) 0.005 % ophthalmic solution  1 drop at bedtime.   Past Medical History:  Diagnosis Date  . Fatty liver   . GERD (gastroesophageal reflux disease)   . Glaucoma    potentially will have - needs eye drops  . History of kidney stones   . Hypercholesterolemia    Past Surgical History:  Procedure Laterality Date  . CARDIOVASCULAR STRESS TEST    . DENTAL RESTORATION/EXTRACTION WITH X-RAY    . ESOPHAGOGASTRODUODENOSCOPY  2018   Social History   Social History Narrative   Married, 5 children, 2 from first relationship to from current wife   [employer] secretary   No EtOH   Former smoker   No drug use   family history includes COPD in his father; Cirrhosis in his maternal aunt; Diabetes in his maternal grandmother; Heart disease in his brother and father; Hypertension in his father; Pancreatic cancer in his mother.   Review of Systems As per HPI  Objective:   Physical Exam BP (!) 140/92   Pulse 84   Temp 97.8 F (36.6 C)   Ht 5\' 11"  (1.803 m)   Wt 220 lb (99.8 kg)   BMI 30.68 kg/m  No CVAT abd mod diastasis recti, o/w neg

## 2020-02-13 NOTE — Patient Instructions (Addendum)
Healthy and nutritious eating and weight loss are made to be harder than they need to be. A simplified diet approach based around eating normally, as much as you want without restricting intake too much is better, I think. You must avoid packaged foods and try to eat real foods as much as possible. Packaged foods, sugary sodas, highly processed foods taste great but are slow poisons that lead to obesity and/or poor health.  It is very helpful to take some time each week and plan your meals. Work with your spouse, partner, family to do this as much as possible. Preparing meals ahead to take to work or school is especially helpful and will save money, too. You can do this and by working together it can take less time.  Another way to help is to order food on-line for pick-up (or delivery if you can afford) and you will avoid impulse buys of unhealthy foods.  Some resources that I like are:  www.gaplesinstitute.org (Do the learning modules about healthy eating)  www.dietdoctor.com - helps with low carb diets and also can learn about and consider intermittent  fasting. If you have diabetes would not do intermittent fasting without checking with your doctor. Best to change what you eat before doing this.  Www.drberry.com   Here are some guidelines to help you with meal planning -  Avoid all processed and packaged foods (bread, pasta, crackers, chips, etc) and beverages containing calories.  Avoid added sugars and excessive natural sugars.  Attention to how you feel if you consume artificial sweeteners.  Do they make you more hungry or raise your blood sugar?   Cut out dairy.    You have been scheduled for an abdominal ultrasound at [facility] on 02/22/2020 at 8:00AM. Please arrive 20 minutes prior to your appointment for registration. Make certain not to have anything to eat or drink 6 hours prior to your appointment. Should you need to reschedule your appointment, please contact radiology at  [phone]. This test typically takes about 30 minutes to perform.   I appreciate the opportunity to care for you. Silvano Rusk, MD, [facility]

## 2020-02-19 ENCOUNTER — Encounter: Payer: Self-pay | Admitting: Neurology

## 2020-02-19 ENCOUNTER — Ambulatory Visit (INDEPENDENT_AMBULATORY_CARE_PROVIDER_SITE_OTHER): Payer: BC Managed Care – PPO | Admitting: Neurology

## 2020-02-19 ENCOUNTER — Ambulatory Visit: Payer: BC Managed Care – PPO | Admitting: Neurology

## 2020-02-19 ENCOUNTER — Other Ambulatory Visit: Payer: Self-pay

## 2020-02-19 DIAGNOSIS — M79602 Pain in left arm: Secondary | ICD-10-CM | POA: Diagnosis not present

## 2020-02-19 NOTE — Procedures (Signed)
     HISTORY:  Blake Jordan is a 39 year old gentleman with an injury to the left arm and shoulder in 2018 when he was lifting heavy weights.  He has had persistent neuromuscular pain since that time.  MRI of the cervical spine has not shown any etiology for this.  He is being evaluated for any nerve injury.  NERVE CONDUCTION STUDIES:  Nerve conduction studies were performed on the left upper extremity. The distal motor latencies and motor amplitudes for the median and ulnar nerves were within normal limits. The nerve conduction velocities for these nerves were also normal. The sensory latencies for the median, radial, and ulnar nerves were normal. The F wave latency for the ulnar nerve was within normal limits.   EMG STUDIES:  EMG study was performed on the left upper extremity:  The first dorsal interosseous muscle reveals 2 to 4 K units with full recruitment. No fibrillations or positive waves were noted. The abductor pollicis brevis muscle reveals 2 to 4 K units with full recruitment. No fibrillations or positive waves were noted. The extensor indicis proprius muscle reveals 1 to 3 K units with full recruitment. No fibrillations or positive waves were noted. The pronator teres muscle reveals 2 to 3 K units with full recruitment. No fibrillations or positive waves were noted. The biceps muscle reveals 1 to 2 K units with full recruitment. No fibrillations or positive waves were noted. The triceps muscle reveals 2 to 4 K units with full recruitment. No fibrillations or positive waves were noted. The anterior deltoid muscle reveals 2 to 3 K units with full recruitment. No fibrillations or positive waves were noted. The cervical paraspinal muscles were tested at 2 levels. No abnormalities of insertional activity were seen at either level tested. There was good relaxation.   IMPRESSION:  Nerve conduction studies done on the left upper extremity were within normal limits.  No evidence  of a neuropathy was seen.  EMG evaluation of the left upper extremity is normal, no evidence of an overlying cervical radiculopathy is seen.  Jill Alexanders MD 02/19/2020 3:44 PM  [facility] [address] [place], [place] [address]  Phone [phone] Fax [phone]

## 2020-02-19 NOTE — Progress Notes (Addendum)
The patient comes in today for EMG nerve conduction study.  The study is completely normal.  The patient has ongoing neuromuscular discomfort in the left shoulder and upper chest.  MRI of the cervical spine has not shown any etiology for this, EMG study is also negative.  The patient will continue on with neuromuscular therapy with muscle toning exercises and range of movement exercises.  He is being followed by [facility].     [place]    Nerve / Sites Muscle Latency Ref. Amplitude Ref. Rel Amp Segments Distance Velocity Ref. Area    ms ms mV mV %  cm m/s m/s mVms  L Median - APB     Wrist APB 3.0 ?4.4 7.7 ?4.0 100 Wrist - APB 7   29.1     Upper arm APB 6.8  6.4  82.8 Upper arm - Wrist 23 62 ?49 24.4  L Ulnar - ADM     Wrist ADM 2.5 ?3.3 13.0 ?6.0 100 Wrist - ADM 7   38.1     B.Elbow ADM 5.9  11.9  91.6 B.Elbow - Wrist 22 64 ?49 37.2     A.Elbow ADM 7.5  11.3  94.7 A.Elbow - B.Elbow 10 65 ?49 36.4         A.Elbow - Wrist             SNC    Nerve / Sites Rec. Site Peak Lat Ref.  Amp Ref. Segments Distance Peak Diff Ref.    ms ms V V  cm ms ms  L Radial - Anatomical snuff box (Forearm)     Forearm Wrist 2.3 ?2.9 33 ?15 Forearm - Wrist 10    L Median, Ulnar - Transcarpal comparison     Median Palm Wrist 1.8 ?2.2 136 ?35 Median Palm - Wrist 8       Ulnar Palm Wrist 1.8 ?2.2 37 ?12 Ulnar Palm - Wrist 8          Median Palm - Ulnar Palm  0.0 ?0.4  L Median - Orthodromic (Dig II, Mid palm)     Dig II Wrist 2.7 ?3.4 19 ?10 Dig II - Wrist 13    L Ulnar - Orthodromic, (Dig V, Mid palm)     Dig V Wrist 2.8 ?3.1 18 ?5 Dig V - Wrist 75               F  Wave    Nerve F Lat Ref.   ms ms  L Ulnar - ADM 28.6 ?32.0

## 2020-02-19 NOTE — Progress Notes (Signed)
Please refer to EMG and nerve conduction procedure note.  

## 2020-02-20 ENCOUNTER — Ambulatory Visit: Payer: BC Managed Care – PPO | Admitting: Family Medicine

## 2020-02-22 ENCOUNTER — Ambulatory Visit
Admission: RE | Admit: 2020-02-22 | Discharge: 2020-02-22 | Disposition: A | Discharge status: Home / Self Care | Payer: BC Managed Care – PPO | Source: Ambulatory Visit | Attending: Internal Medicine | Admitting: Internal Medicine

## 2020-02-22 DIAGNOSIS — R1011 Right upper quadrant pain: Secondary | ICD-10-CM

## 2020-02-22 DIAGNOSIS — K760 Fatty (change of) liver, not elsewhere classified: Secondary | ICD-10-CM | POA: Diagnosis not present

## 2020-02-22 DIAGNOSIS — R1013 Epigastric pain: Secondary | ICD-10-CM

## 2020-03-12 ENCOUNTER — Other Ambulatory Visit: Payer: Self-pay

## 2020-03-12 ENCOUNTER — Other Ambulatory Visit: Payer: BC Managed Care – PPO

## 2020-03-12 DIAGNOSIS — K760 Fatty (change of) liver, not elsewhere classified: Secondary | ICD-10-CM

## 2020-03-12 DIAGNOSIS — R7989 Other specified abnormal findings of blood chemistry: Secondary | ICD-10-CM | POA: Diagnosis not present

## 2020-03-12 DIAGNOSIS — R5383 Other fatigue: Secondary | ICD-10-CM | POA: Diagnosis not present

## 2020-03-12 DIAGNOSIS — Z131 Encounter for screening for diabetes mellitus: Secondary | ICD-10-CM

## 2020-03-12 DIAGNOSIS — Z13220 Encounter for screening for lipoid disorders: Secondary | ICD-10-CM | POA: Diagnosis not present

## 2020-03-12 LAB — LIPID PANEL

## 2020-03-15 ENCOUNTER — Ambulatory Visit: Payer: BC Managed Care – PPO | Admitting: Family Medicine

## 2020-03-15 LAB — CBC WITH DIFFERENTIAL/PLATELET
Basophils Absolute: 0.1 10*3/uL (ref 0.0–0.2)
Basos: 1 %
EOS (ABSOLUTE): 0.2 10*3/uL (ref 0.0–0.4)
Eos: 4 %
Hematocrit: 45.9 % (ref 37.5–51.0)
Hemoglobin: 15.6 g/dL (ref 13.0–17.7)
Immature Grans (Abs): 0 10*3/uL (ref 0.0–0.1)
Immature Granulocytes: 0 %
Lymphocytes Absolute: 2.4 10*3/uL (ref 0.7–3.1)
Lymphs: 39 %
MCH: 30.1 pg (ref 26.6–33.0)
MCHC: 34 g/dL (ref 31.5–35.7)
MCV: 89 fL (ref 79–97)
Monocytes Absolute: 0.5 10*3/uL (ref 0.1–0.9)
Monocytes: 7 %
Neutrophils Absolute: 3.1 10*3/uL (ref 1.4–7.0)
Neutrophils: 49 %
Platelets: 274 10*3/uL (ref 150–450)
RBC: 5.18 x10E6/uL (ref 4.14–5.80)
RDW: 12.4 % (ref 11.6–15.4)
WBC: 6.2 10*3/uL (ref 3.4–10.8)

## 2020-03-15 LAB — LIPID PANEL
Chol/HDL Ratio: 6.5 ratio — ABNORMAL HIGH (ref 0.0–5.0)
Cholesterol, Total: 228 mg/dL — ABNORMAL HIGH (ref 100–199)
HDL: 35 mg/dL — ABNORMAL LOW (ref >39)
LDL Chol Calc (NIH): 176 mg/dL — ABNORMAL HIGH (ref 0–99)
Triglycerides: 97 mg/dL (ref 0–149)
VLDL Cholesterol Cal: 17 mg/dL (ref 5–40)

## 2020-03-15 LAB — CMP14+EGFR
ALT: 55 IU/L — ABNORMAL HIGH (ref 0–44)
AST: 30 IU/L (ref 0–40)
Albumin/Globulin Ratio: 2.5 — ABNORMAL HIGH (ref 1.2–2.2)
Albumin: 5 g/dL (ref 4.0–5.0)
Alkaline Phosphatase: 46 IU/L (ref 39–117)
BUN/Creatinine Ratio: 11 (ref 9–20)
BUN: 12 mg/dL (ref 6–20)
Bilirubin Total: 0.4 mg/dL (ref 0.0–1.2)
CO2: 24 mmol/L (ref 20–29)
Calcium: 10 mg/dL (ref 8.7–10.2)
Chloride: 103 mmol/L (ref 96–106)
Creatinine, Ser: 1.07 mg/dL (ref 0.76–1.27)
GFR calc Af Amer: 102 mL/min/{1.73_m2} (ref >59)
GFR calc non Af Amer: 88 mL/min/{1.73_m2} (ref >59)
Globulin, Total: 2 g/dL (ref 1.5–4.5)
Glucose: 92 mg/dL (ref 65–99)
Potassium: 5 mmol/L (ref 3.5–5.2)
Sodium: 142 mmol/L (ref 134–144)
Total Protein: 7 g/dL (ref 6.0–8.5)

## 2020-03-15 LAB — TSH: TSH: 2.87 u[IU]/mL (ref 0.450–4.500)

## 2020-03-15 LAB — TESTOSTERONE,FREE AND TOTAL
Testosterone, Free: 12.1 pg/mL (ref 8.7–25.1)
Testosterone: 342 ng/dL (ref 264–916)

## 2020-03-19 ENCOUNTER — Encounter: Payer: Self-pay | Admitting: Family Medicine

## 2020-03-19 ENCOUNTER — Other Ambulatory Visit: Payer: Self-pay

## 2020-03-19 ENCOUNTER — Ambulatory Visit (INDEPENDENT_AMBULATORY_CARE_PROVIDER_SITE_OTHER): Payer: BC Managed Care – PPO | Admitting: Family Medicine

## 2020-03-19 VITALS — BP 116/68 | HR 87 | Temp 98.0°F | Ht 71.0 in | Wt 213.0 lb

## 2020-03-19 DIAGNOSIS — F419 Anxiety disorder, unspecified: Secondary | ICD-10-CM | POA: Diagnosis not present

## 2020-03-19 MED ORDER — DULOXETINE HCL 30 MG PO CPEP: 30.0000 mg | ORAL_CAPSULE | Freq: Every day | ORAL | 1 refills | Status: AC

## 2020-03-19 NOTE — Progress Notes (Signed)
BP 116/68   Pulse 87   Temp 98 F (36.7 C)   Ht _0  (1.803 m)   Wt 213 lb (96.6 kg)   SpO2 96%   BMI 29.71 kg/m    Subjective:   Patient ID: Blake Jordan, male    DOB: [birth year], 39 y.o.   MRN: [id number]  HPI: Blake Jordan is a 39 y.o. male presenting on 03/19/2020 for Follow up Labs/TSH, Testosterone and body pain (Left sided)  1. Follow Up Labs (TSH/Testosterone):  Mr. Dalesandro latest TSH and Testosterone labs were WNL. He reports that he is still feeling tired all the time. He has improved his diet and is able to get some exercise at work (10,000 daily steps). He is wondering whether he can take a natural Testosterone supplement to try and boost his Testosterone levels.  2. Left-Sided Body Pain:  Mr. Lebeau has had left neck, shoulder, arm, and upper back pain for about 3 years. He has had cardiac stress testing, a cardiac telemetry monitor, and neurology testing performed, all of which have been normal. Mr. Nazir has tried Physical Therapy which seems to help for a little while, but the pain comes back after a short time. He describes the pain as an aching pain and says it is a 3/10 on the pain scale. He reports occasional shortness of breath and palpitations. The palpitations sometimes wake him up at night. He also reports feeling lightheaded and dizzy on occasion. He reports hot flashes. He denies nausea and vomiting.   Relevant past medical, surgical, family and social history reviewed and updated as indicated. Interim medical history since our last visit reviewed.  Allergies and medications reviewed and updated.  Review of Systems  Constitutional: Negative for chills and fever.  Respiratory: Positive for shortness of breath. Negative for wheezing.   Cardiovascular: Positive for palpitations. Negative for chest pain and leg swelling.  Musculoskeletal: Positive for arthralgias, back pain and neck pain. Negative for gait problem.  Skin: Negative for rash.    All other systems reviewed and are negative.   Per HPI unless specifically indicated above   Allergies as of 03/19/2020   No Known Allergies     Medication List       Accurate as of March 19, 2020 11:59 PM. If you have any questions, ask your nurse or doctor.        baclofen 10 MG tablet Commonly known as: LIORESAL Take 10 mg by mouth 2 (two) times daily as needed.   cyclobenzaprine 5 MG tablet Commonly known as: FLEXERIL Take 5-10 mg by mouth at bedtime as needed.   DULoxetine 30 MG capsule Commonly known as: Cymbalta Take 1 capsule (30 mg total) by mouth daily. Started by: Worthy Rancher, MD   HYDROcodone-acetaminophen 5-325 MG tablet Commonly known as: NORCO/VICODIN Take 1 tablet by mouth every 6 (six) hours as needed.   latanoprost 0.005 % ophthalmic solution Commonly known as: XALATAN 1 drop at bedtime.   methocarbamol 500 MG tablet Commonly known as: ROBAXIN Take 500 mg by mouth every 6 (six) hours as needed.   polyethylene glycol 17 g packet Commonly known as: MIRALAX / GLYCOLAX Take 17 g by mouth daily as needed. hasnt used in a week        Objective:   BP 116/68   Pulse 87   Temp 98 F (36.7 C)   Ht _1  (1.803 m)   Wt 213 lb (96.6 kg)   SpO2 96%   BMI 29.71  kg/m   Wt Readings from Last 3 Encounters:  03/19/20 213 lb (96.6 kg)  02/13/20 220 lb (99.8 kg)  12/21/19 216 lb 6.4 oz (98.2 kg)    Physical Exam Constitutional:      General: He is not in acute distress.    Appearance: Normal appearance.  Cardiovascular:     Rate and Rhythm: Normal rate and regular rhythm.     Heart sounds: Normal heart sounds.  Pulmonary:     Effort: Pulmonary effort is normal.     Breath sounds: Normal breath sounds. No wheezing, rhonchi or rales.  Musculoskeletal:        General: Tenderness (Patient's upper back is tender to palpation over the trapezius muscle area.) present. Normal range of motion.  Neurological:     Mental Status: He is alert  and oriented to person, place, and time.     Coordination: Coordination normal.     Gait: Gait normal.  Psychiatric:        Mood and Affect: Mood is anxious.        Behavior: Behavior normal.        Thought Content: Thought content normal. Thought content does not include suicidal ideation. Thought content does not include suicidal plan.        Judgment: Judgment normal.    Results for orders placed or performed in visit on 03/12/20  Testosterone,Free and Total  Result Value Ref Range   Testosterone 342 264 - 916 ng/dL   Testosterone, Free 12.1 8.7 - 25.1 pg/mL  TSH  Result Value Ref Range   TSH 2.870 0.450 - 4.500 uIU/mL  Lipid panel  Result Value Ref Range   Cholesterol, Total 228 (H) 100 - 199 mg/dL   Triglycerides 97 0 - 149 mg/dL   HDL 35 (L) >39 mg/dL   VLDL Cholesterol Cal 17 5 - 40 mg/dL   LDL Chol Calc (NIH) 176 (H) 0 - 99 mg/dL   Chol/HDL Ratio 6.5 (H) 0.0 - 5.0 ratio  CMP14+EGFR  Result Value Ref Range   Glucose 92 65 - 99 mg/dL   BUN 12 6 - 20 mg/dL   Creatinine, Ser 1.07 0.76 - 1.27 mg/dL   GFR calc non Af Amer 88 >59 mL/min/1.73   GFR calc Af Amer 102 >59 mL/min/1.73   BUN/Creatinine Ratio 11 9 - 20   Sodium 142 134 - 144 mmol/L   Potassium 5.0 3.5 - 5.2 mmol/L   Chloride 103 96 - 106 mmol/L   CO2 24 20 - 29 mmol/L   Calcium 10.0 8.7 - 10.2 mg/dL   Total Protein 7.0 6.0 - 8.5 g/dL   Albumin 5.0 4.0 - 5.0 g/dL   Globulin, Total 2.0 1.5 - 4.5 g/dL   Albumin/Globulin Ratio 2.5 (H) 1.2 - 2.2   Bilirubin Total 0.4 0.0 - 1.2 mg/dL   Alkaline Phosphatase 46 39 - 117 IU/L   AST 30 0 - 40 IU/L   ALT 55 (H) 0 - 44 IU/L  CBC with Differential/Platelet  Result Value Ref Range   WBC 6.2 3.4 - 10.8 x10E3/uL   RBC 5.18 4.14 - 5.80 x10E6/uL   Hemoglobin 15.6 13.0 - 17.7 g/dL   Hematocrit 45.9 37.5 - 51.0 %   MCV 89 79 - 97 fL   MCH 30.1 26.6 - 33.0 pg   MCHC 34.0 31.5 - 35.7 g/dL   RDW 12.4 11.6 - 15.4 %   Platelets 274 150 - 450 x10E3/uL   Neutrophils 49 Not  Estab. %  Lymphs 39 Not Estab. %   Monocytes 7 Not Estab. %   Eos 4 Not Estab. %   Basos 1 Not Estab. %   Neutrophils Absolute 3.1 1.4 - 7.0 x10E3/uL   Lymphocytes Absolute 2.4 0.7 - 3.1 x10E3/uL   Monocytes Absolute 0.5 0.1 - 0.9 x10E3/uL   EOS (ABSOLUTE) 0.2 0.0 - 0.4 x10E3/uL   Basophils Absolute 0.1 0.0 - 0.2 x10E3/uL   Immature Granulocytes 0 Not Estab. %   Immature Grans (Abs) 0.0 0.0 - 0.1 x10E3/uL    Assessment & Plan:   Problem List Items Addressed This Visit      Other   Anxiety - Primary   Relevant Medications   DULoxetine (CYMBALTA) 30 MG capsule      1. Follow Up Labs (TSH/Testosterone):  Mr. Delo is encouraged to continue modifying his diet and to keep up with working out/exercise. Okay to try natural Testosterone supplements.  2. Left-Sided Pain:  Dr. Warrick Parisian discussed with Mr. Jurewicz that given the negative cardiac and neuro workup for his pain, the pain symptoms are likely due to his anxiety. Will start Cymbalta for anxiety and follow up in 4 weeks. Mr. Comas was agreeable to this plan.  Follow up plan: Return in about 4 weeks (around 04/16/2020), or if symptoms worsen or fail to improve, for Anxiety recheck.  Gaynelle Arabian, PA-S2 [facility] 03/22/2020, 7:42 AM  Patient has had an extensive cardiac work-up and neuro work-up and has not found a source, his pain tends to be related to stress related time periods, we will try Cymbalta to see if it helps with his stress and helps reduce the pain.  Patient takes testosterone levels are reasonable but then levels were normal.  Patient seen and examined with Gaynelle Arabian, PA student, agree with assessment and plan above Caryl Pina, MD [facility] 03/22/2020, 7:44 AM

## 2020-03-22 DIAGNOSIS — M542 Cervicalgia: Secondary | ICD-10-CM | POA: Diagnosis not present

## 2020-03-31 DIAGNOSIS — M546 Pain in thoracic spine: Secondary | ICD-10-CM | POA: Diagnosis not present

## 2020-03-31 DIAGNOSIS — M25512 Pain in left shoulder: Secondary | ICD-10-CM | POA: Diagnosis not present

## 2020-03-31 DIAGNOSIS — R5383 Other fatigue: Secondary | ICD-10-CM | POA: Diagnosis not present

## 2020-03-31 DIAGNOSIS — Z131 Encounter for screening for diabetes mellitus: Secondary | ICD-10-CM | POA: Diagnosis not present

## 2020-03-31 DIAGNOSIS — E7800 Pure hypercholesterolemia, unspecified: Secondary | ICD-10-CM | POA: Diagnosis not present

## 2020-03-31 DIAGNOSIS — Z20822 Contact with and (suspected) exposure to covid-19: Secondary | ICD-10-CM | POA: Diagnosis not present

## 2020-03-31 DIAGNOSIS — M542 Cervicalgia: Secondary | ICD-10-CM | POA: Diagnosis not present

## 2020-03-31 DIAGNOSIS — M79602 Pain in left arm: Secondary | ICD-10-CM | POA: Diagnosis not present

## 2020-03-31 DIAGNOSIS — Z79899 Other long term (current) drug therapy: Secondary | ICD-10-CM | POA: Diagnosis not present

## 2020-03-31 DIAGNOSIS — M129 Arthropathy, unspecified: Secondary | ICD-10-CM | POA: Diagnosis not present

## 2020-04-02 ENCOUNTER — Telehealth: Payer: Self-pay | Admitting: Family Medicine

## 2020-04-02 DIAGNOSIS — R0789 Other chest pain: Secondary | ICD-10-CM

## 2020-04-02 DIAGNOSIS — R002 Palpitations: Secondary | ICD-10-CM

## 2020-04-02 NOTE — Telephone Encounter (Signed)
  REFERRAL REQUEST Telephone Note 04/02/2020  What type of referral do you need? Heart Doctor  Have you been seen at our office for this problem? yes (Advise that they may need an appointment with their PCP before a referral can be done)  Is there a particular doctor or location that you prefer? [facility]  Patient notified that referrals can take up to a week or longer to process. If they haven't heard anything within a week they should call back and speak with the referral department.   Dettinger's pt.  He keeps having episodes & anxiety is worse bc stressing over this.  Please call.

## 2020-04-08 NOTE — Telephone Encounter (Signed)
Placed referral to cardiology. 

## 2020-04-11 DIAGNOSIS — M25512 Pain in left shoulder: Secondary | ICD-10-CM | POA: Diagnosis not present

## 2020-04-12 DIAGNOSIS — M79602 Pain in left arm: Secondary | ICD-10-CM | POA: Diagnosis not present

## 2020-04-12 DIAGNOSIS — M25512 Pain in left shoulder: Secondary | ICD-10-CM | POA: Diagnosis not present

## 2020-04-12 DIAGNOSIS — M542 Cervicalgia: Secondary | ICD-10-CM | POA: Diagnosis not present

## 2020-04-12 DIAGNOSIS — M546 Pain in thoracic spine: Secondary | ICD-10-CM | POA: Diagnosis not present

## 2020-04-24 ENCOUNTER — Ambulatory Visit: Payer: BC Managed Care – PPO | Admitting: Family Medicine

## 2020-04-25 ENCOUNTER — Encounter: Payer: Self-pay | Admitting: Family Medicine

## 2020-04-26 ENCOUNTER — Ambulatory Visit: Payer: BC Managed Care – PPO | Admitting: Cardiology

## 2020-05-01 DIAGNOSIS — R0602 Shortness of breath: Secondary | ICD-10-CM | POA: Diagnosis not present

## 2020-05-13 DIAGNOSIS — R0602 Shortness of breath: Secondary | ICD-10-CM | POA: Diagnosis not present

## 2020-05-17 DIAGNOSIS — R079 Chest pain, unspecified: Secondary | ICD-10-CM | POA: Diagnosis not present

## 2020-05-18 IMAGING — DX DG LUMBAR SPINE 2-3V
3 series · 3 of 3 positions shown · non-contrast
Comparison: None.

CLINICAL DATA: Low back pain for 1 year, no known injury, initial
encounter

EXAM:
LUMBAR SPINE - 3 VIEW

[l-spine ap]
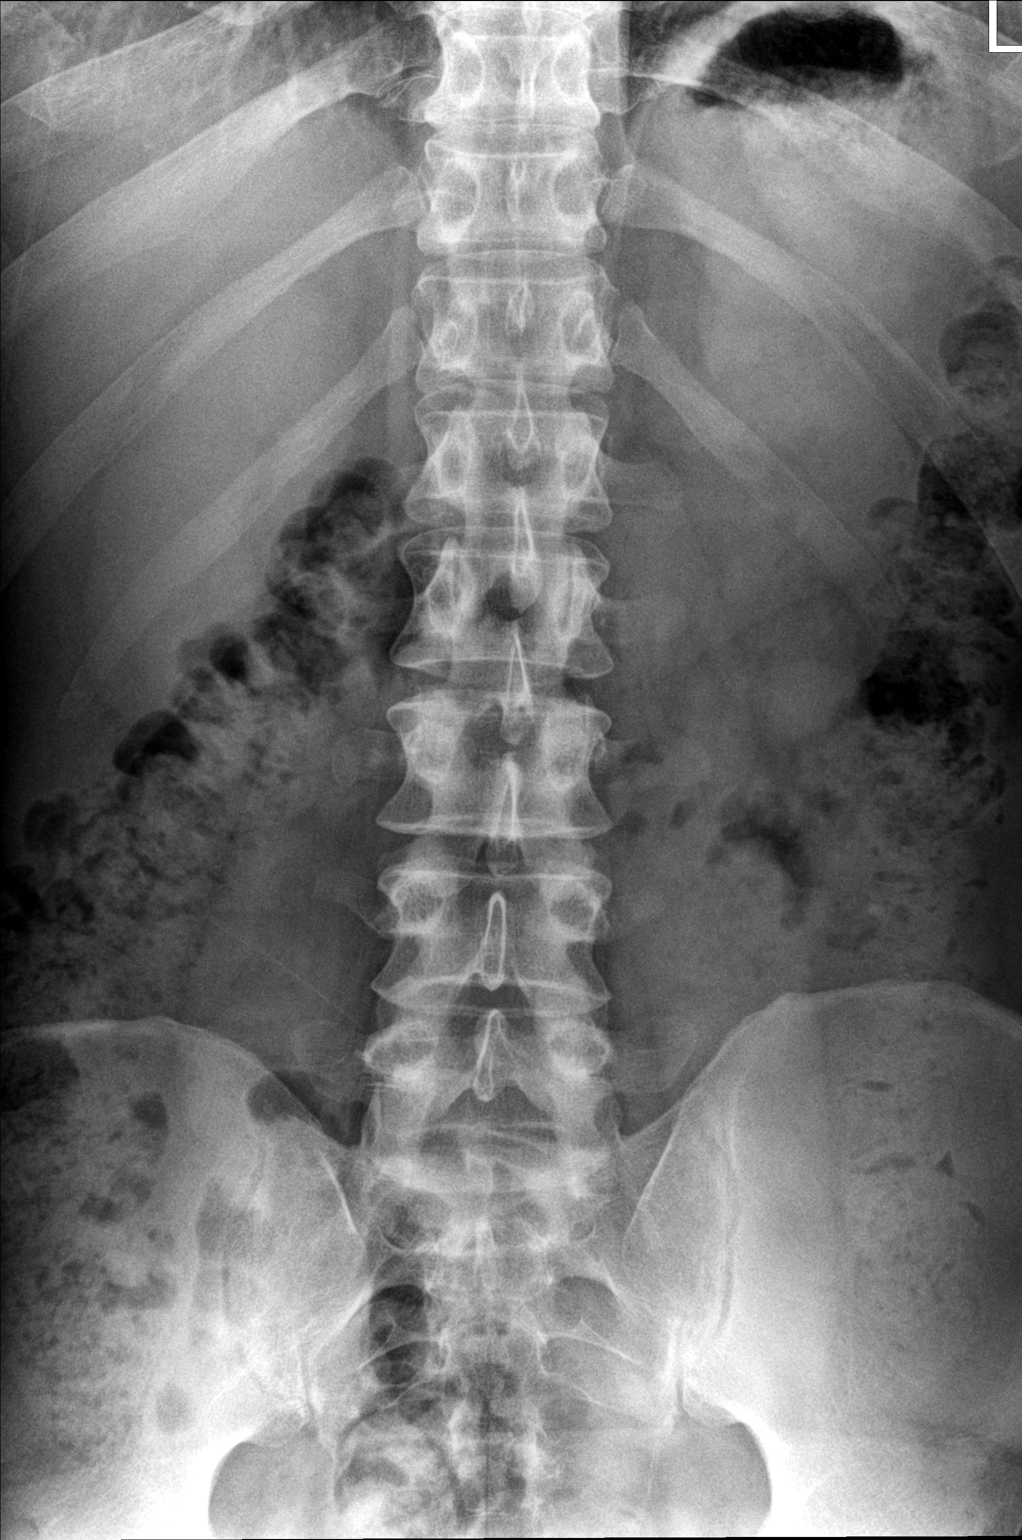

[l-spine lat (1 of 2)]
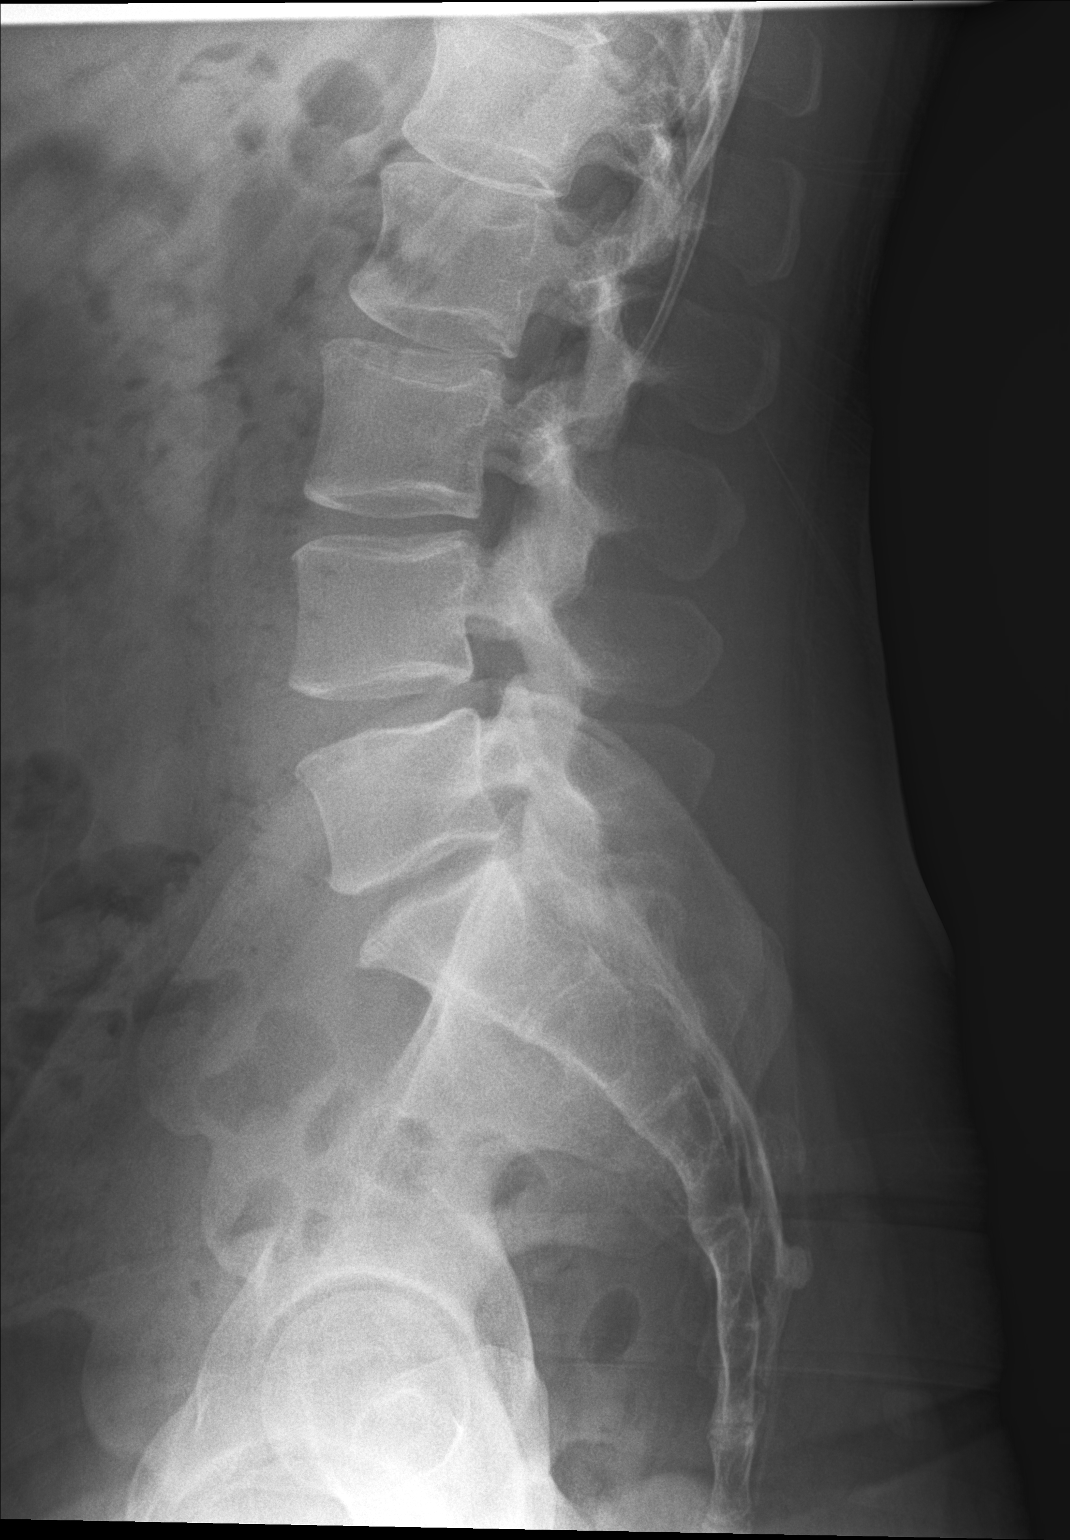

[l-spine lat (2 of 2)]
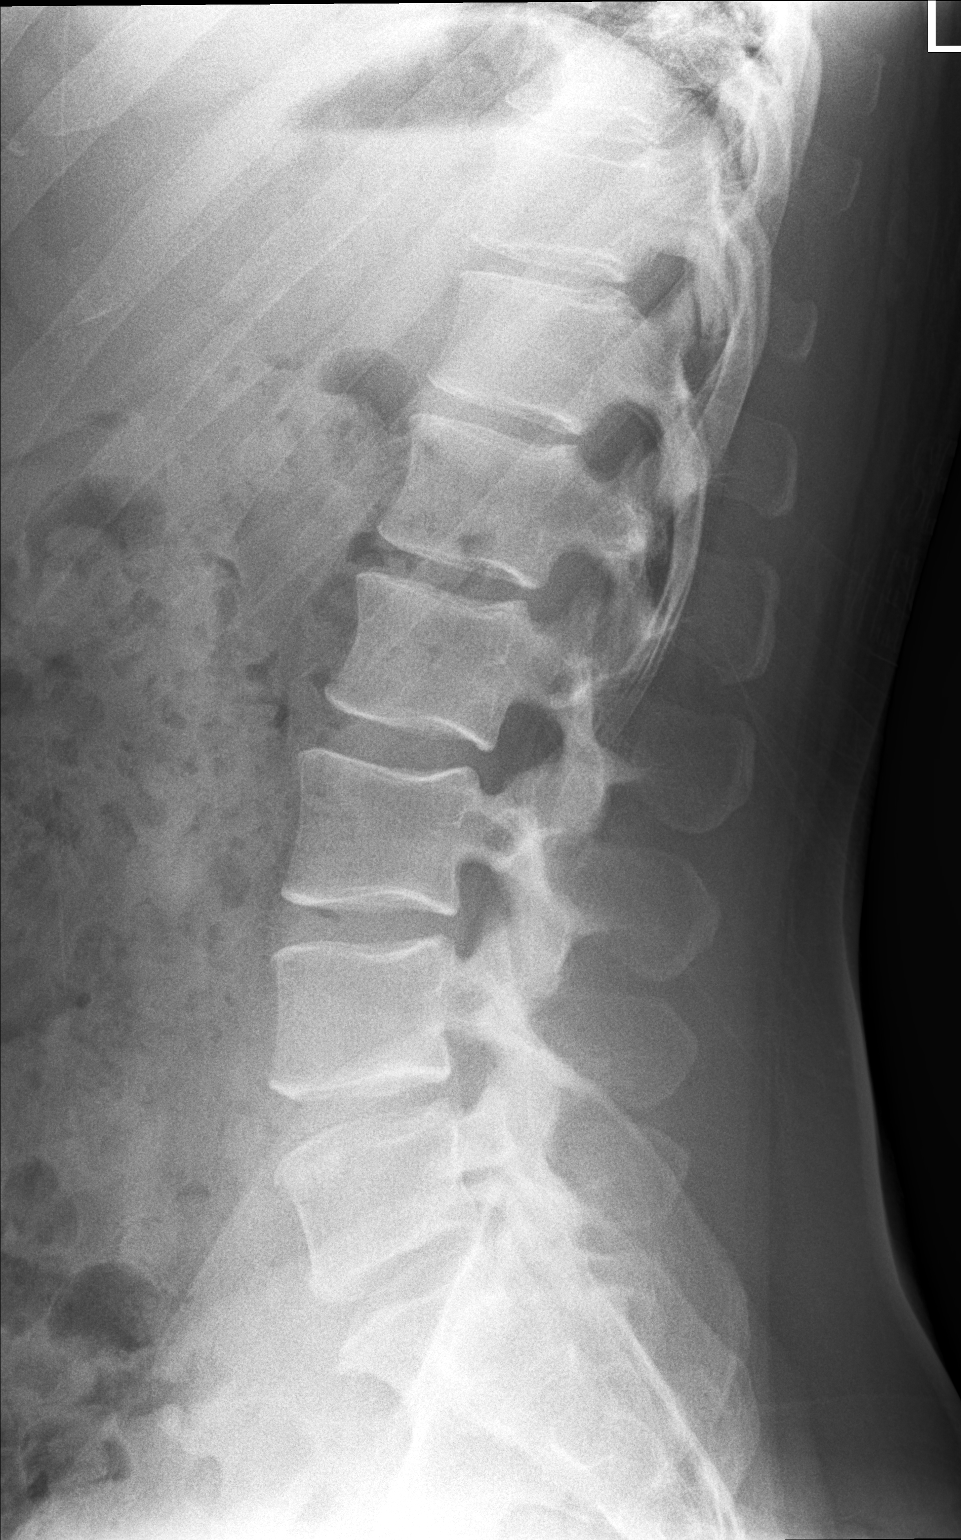

[3 of 3 positions shown; findings below may reference images not displayed]

FINDINGS: Five lumbar type vertebral bodies are well visualized. Vertebral
body height is well maintained. No anterolisthesis is seen. Mild
disc space narrowing is noted at L4-5 and L5-S1. No soft tissue
abnormality is noted.
IMPRESSION: Mild degenerative change without acute abnormality.

## 2020-05-22 DIAGNOSIS — Z1159 Encounter for screening for other viral diseases: Secondary | ICD-10-CM | POA: Diagnosis not present

## 2020-05-22 DIAGNOSIS — R945 Abnormal results of liver function studies: Secondary | ICD-10-CM | POA: Diagnosis not present

## 2020-05-30 DIAGNOSIS — R079 Chest pain, unspecified: Secondary | ICD-10-CM | POA: Diagnosis not present

## 2020-06-15 DIAGNOSIS — M25512 Pain in left shoulder: Secondary | ICD-10-CM | POA: Diagnosis not present

## 2020-06-15 DIAGNOSIS — G5680 Other specified mononeuropathies of unspecified upper limb: Secondary | ICD-10-CM | POA: Diagnosis not present

## 2020-06-15 DIAGNOSIS — R079 Chest pain, unspecified: Secondary | ICD-10-CM | POA: Diagnosis not present

## 2020-07-09 DIAGNOSIS — M67412 Ganglion, left shoulder: Secondary | ICD-10-CM | POA: Diagnosis not present

## 2020-07-09 DIAGNOSIS — M25512 Pain in left shoulder: Secondary | ICD-10-CM | POA: Diagnosis not present

## 2020-07-10 DIAGNOSIS — M531 Cervicobrachial syndrome: Secondary | ICD-10-CM | POA: Diagnosis not present

## 2020-07-10 DIAGNOSIS — M546 Pain in thoracic spine: Secondary | ICD-10-CM | POA: Diagnosis not present

## 2020-07-10 DIAGNOSIS — M542 Cervicalgia: Secondary | ICD-10-CM | POA: Diagnosis not present

## 2020-07-15 DIAGNOSIS — Z20822 Contact with and (suspected) exposure to covid-19: Secondary | ICD-10-CM | POA: Diagnosis not present

## 2020-08-17 DIAGNOSIS — E7800 Pure hypercholesterolemia, unspecified: Secondary | ICD-10-CM | POA: Diagnosis not present

## 2020-08-17 DIAGNOSIS — M79602 Pain in left arm: Secondary | ICD-10-CM | POA: Diagnosis not present

## 2020-08-17 DIAGNOSIS — E559 Vitamin D deficiency, unspecified: Secondary | ICD-10-CM | POA: Diagnosis not present

## 2020-08-17 DIAGNOSIS — M546 Pain in thoracic spine: Secondary | ICD-10-CM | POA: Diagnosis not present

## 2020-08-17 DIAGNOSIS — Z79899 Other long term (current) drug therapy: Secondary | ICD-10-CM | POA: Diagnosis not present

## 2020-08-17 DIAGNOSIS — Z1159 Encounter for screening for other viral diseases: Secondary | ICD-10-CM | POA: Diagnosis not present

## 2020-08-17 DIAGNOSIS — M129 Arthropathy, unspecified: Secondary | ICD-10-CM | POA: Diagnosis not present

## 2020-08-17 DIAGNOSIS — M25512 Pain in left shoulder: Secondary | ICD-10-CM | POA: Diagnosis not present

## 2020-08-17 DIAGNOSIS — R5383 Other fatigue: Secondary | ICD-10-CM | POA: Diagnosis not present

## 2020-08-17 DIAGNOSIS — M542 Cervicalgia: Secondary | ICD-10-CM | POA: Diagnosis not present

## 2020-08-17 DIAGNOSIS — D539 Nutritional anemia, unspecified: Secondary | ICD-10-CM | POA: Diagnosis not present

## 2020-08-17 DIAGNOSIS — R7303 Prediabetes: Secondary | ICD-10-CM | POA: Diagnosis not present

## 2020-08-17 DIAGNOSIS — Z7189 Other specified counseling: Secondary | ICD-10-CM | POA: Diagnosis not present

## 2020-08-17 DIAGNOSIS — Z23 Encounter for immunization: Secondary | ICD-10-CM | POA: Diagnosis not present

## 2020-08-19 DIAGNOSIS — M531 Cervicobrachial syndrome: Secondary | ICD-10-CM | POA: Diagnosis not present

## 2020-08-19 DIAGNOSIS — M546 Pain in thoracic spine: Secondary | ICD-10-CM | POA: Diagnosis not present

## 2020-08-19 DIAGNOSIS — M542 Cervicalgia: Secondary | ICD-10-CM | POA: Diagnosis not present

## 2020-08-20 DIAGNOSIS — H401131 Primary open-angle glaucoma, bilateral, mild stage: Secondary | ICD-10-CM | POA: Diagnosis not present

## 2020-09-04 DIAGNOSIS — M25512 Pain in left shoulder: Secondary | ICD-10-CM | POA: Diagnosis not present

## 2020-11-26 ENCOUNTER — Emergency Department (HOSPITAL_COMMUNITY)
Admission: EM | Admit: 2020-11-26 | Discharge: 2020-11-26 | Disposition: A | Discharge status: Home / Self Care | Payer: BC Managed Care – PPO | Attending: Emergency Medicine | Admitting: Emergency Medicine

## 2020-11-26 ENCOUNTER — Emergency Department (HOSPITAL_COMMUNITY): Payer: BC Managed Care – PPO

## 2020-11-26 ENCOUNTER — Other Ambulatory Visit: Payer: Self-pay

## 2020-11-26 DIAGNOSIS — Z87891 Personal history of nicotine dependence: Secondary | ICD-10-CM | POA: Insufficient documentation

## 2020-11-26 DIAGNOSIS — U071 COVID-19: Secondary | ICD-10-CM

## 2020-11-26 DIAGNOSIS — M25512 Pain in left shoulder: Secondary | ICD-10-CM | POA: Insufficient documentation

## 2020-11-26 DIAGNOSIS — K219 Gastro-esophageal reflux disease without esophagitis: Secondary | ICD-10-CM | POA: Diagnosis not present

## 2020-11-26 DIAGNOSIS — R000 Tachycardia, unspecified: Secondary | ICD-10-CM | POA: Insufficient documentation

## 2020-11-26 DIAGNOSIS — R0981 Nasal congestion: Secondary | ICD-10-CM | POA: Diagnosis present

## 2020-11-26 DIAGNOSIS — R1013 Epigastric pain: Secondary | ICD-10-CM | POA: Diagnosis not present

## 2020-11-26 DIAGNOSIS — Z87442 Personal history of urinary calculi: Secondary | ICD-10-CM | POA: Insufficient documentation

## 2020-11-26 DIAGNOSIS — R12 Heartburn: Secondary | ICD-10-CM

## 2020-11-26 LAB — BASIC METABOLIC PANEL
Anion gap: 14 (ref 5–15)
BUN: 18 mg/dL (ref 6–20)
CO2: 27 mmol/L (ref 22–32)
Calcium: 10.2 mg/dL (ref 8.9–10.3)
Chloride: 98 mmol/L (ref 98–111)
Creatinine, Ser: 1.08 mg/dL (ref 0.61–1.24)
GFR, Estimated: >60 mL/min (ref >60)
Glucose, Bld: 90 mg/dL (ref 70–99)
Potassium: 4.4 mmol/L (ref 3.5–5.1)
Sodium: 139 mmol/L (ref 135–145)

## 2020-11-26 LAB — RESP PANEL BY RT-PCR (RSV, FLU A&B, COVID)  RVPGX2
Influenza A by PCR: NEGATIVE
Influenza B by PCR: NEGATIVE
Resp Syncytial Virus by PCR: NEGATIVE
SARS Coronavirus 2 by RT PCR: POSITIVE — AB

## 2020-11-26 LAB — TROPONIN I (HIGH SENSITIVITY)
Troponin I (High Sensitivity): <2 ng/L (ref <18)
Troponin I (High Sensitivity): <2 ng/L (ref <18)

## 2020-11-26 LAB — CBC
HCT: 53.5 % — ABNORMAL HIGH (ref 39.0–52.0)
Hemoglobin: 16.9 g/dL (ref 13.0–17.0)
MCH: 28.8 pg (ref 26.0–34.0)
MCHC: 31.6 g/dL (ref 30.0–36.0)
MCV: 91.1 fL (ref 80.0–100.0)
Platelets: 232 10*3/uL (ref 150–400)
RBC: 5.87 MIL/uL — ABNORMAL HIGH (ref 4.22–5.81)
RDW: 13.2 % (ref 11.5–15.5)
WBC: 10.5 10*3/uL (ref 4.0–10.5)
nRBC: 0 % (ref 0.0–0.2)

## 2020-11-26 MED ORDER — LIDOCAINE VISCOUS HCL 2 % MT SOLN
15.0000 mL | Freq: Once | OROMUCOSAL | Status: AC
  Administered 2020-11-26: 15 mL via ORAL
  Filled 2020-11-26: qty 15

## 2020-11-26 MED ORDER — OMEPRAZOLE 20 MG PO CPDR: 20.0000 mg | DELAYED_RELEASE_CAPSULE | Freq: Every day | ORAL | 0 refills | Status: AC

## 2020-11-26 MED ORDER — ALUM & MAG HYDROXIDE-SIMETH 200-200-20 MG/5ML PO SUSP
30.0000 mL | Freq: Once | ORAL | Status: AC
  Administered 2020-11-26: 30 mL via ORAL
  Filled 2020-11-26: qty 30

## 2020-11-26 NOTE — Discharge Instructions (Addendum)
You have been seen here for URI like symptoms.  I recommend taking Tylenol for fever control and ibuprofen for pain control please follow dosing on the back of bottle.  I recommend staying hydrated and if you do not an appetite, I recommend soups as this will provide you with fluids and calories.    you are Covid positive you must self quarantine for 10 days starting on symptom onset.  I would like you to contact "post Covid care" as they will provide you with information how to manage your Covid symptoms.  I have started you on an acid pill please take as prescribed given you information for healthy food choices help decrease incident of acid reflux.  Come back to the emergency department if you develop chest pain, shortness of breath, severe abdominal pain, uncontrolled nausea, vomiting, diarrhea.

## 2020-11-26 NOTE — ED Triage Notes (Signed)
Started having heart burn yesterday with midsternal chest pain radiating through to back--- ,  Pain in left shoulder,  left shoulder blade  -- has hx of left arm/shoulder pain-- having chest pain at present-- but states "I always have this"

## 2020-11-26 NOTE — ED Provider Notes (Signed)
[facility] EMERGENCY DEPARTMENT Provider Note   CSN: [id number] Arrival date & time: 11/26/20  1304     History Chief Complaint  Patient presents with  . Chest Pain  . Heartburn    Gareth Fitzner is a 40 y.o. male.  HPI   Patient with no significant medical history presents to the emergency department with chief complaint of acid reflux.  Patient states pain started yesterday night after he ate a large amount of candy, describes it as a burning sensation underneath his sternum, pain does not radiate, denies, shortness of breath or exertion making the pain worse.  He has no cardiac history.  He does endorse that he has some left shoulder pain as well as having a a slight headache and nasal congestion.  Patient states he has had shoulder pain in the past and feels it maybe from how he slept on it.  He endorses that he is not Covid vaccinated and his son recently tested positive for Covid.  He denies history of DVTs or PEs, is not on hormone therapy denies leg pain or leg trauma.  Patient denies alleviating factors.  Patient denies fevers, chills, shortness of breath, abdominal pain, nausea, vomiting, diarrhea, pedal edema.  Past Medical History:  Diagnosis Date  . Fatty liver   . GERD (gastroesophageal reflux disease)   . Glaucoma    potentially will have - needs eye drops  . History of kidney stones   . Hypercholesterolemia     Patient Active Problem List   Diagnosis Date Noted  . Left arm pain 02/19/2020  . Fatty liver 06/06/2018  . Rosacea 03/24/2018  . Anxiety 08/17/2017    Past Surgical History:  Procedure Laterality Date  . CARDIOVASCULAR STRESS TEST    . DENTAL RESTORATION/EXTRACTION WITH X-RAY    . ESOPHAGOGASTRODUODENOSCOPY  2018       Family History  Problem Relation Age of Onset  . Pancreatic cancer Mother   . Heart disease Father   . COPD Father   . Hypertension Father   . Heart disease Brother   . Diabetes Maternal Grandmother   .  Cirrhosis Maternal Aunt        alcohol related  . Colon cancer Neg Hx   . Esophageal cancer Neg Hx   . Stomach cancer Neg Hx     Social History   Tobacco Use  . Smoking status: Former Smoker    Quit date: 11/23/2013    Years since quitting: 7.0  . Smokeless tobacco: Former Neurosurgeon    Quit date: 11/24/2015  Vaping Use  . Vaping Use: Never used  Substance Use Topics  . Alcohol use: No  . Drug use: No    Home Medications Prior to Admission medications   Medication Sig Start Date End Date Taking? Authorizing Provider  omeprazole (PRILOSEC) 20 MG capsule Take 1 capsule (20 mg total) by mouth daily. 11/26/20 12/26/20 Yes Carroll Sage, PA-C  baclofen (LIORESAL) 10 MG tablet Take 10 mg by mouth 2 (two) times daily as needed. 05/18/19   [provider]  cyclobenzaprine (FLEXERIL) 5 MG tablet Take 5-10 mg by mouth at bedtime as needed. 06/30/19   [provider]  DULoxetine (CYMBALTA) 30 MG capsule Take 1 capsule (30 mg total) by mouth daily. 03/19/20   Dettinger, Elige Radon, MD  HYDROcodone-acetaminophen (NORCO/VICODIN) 5-325 MG tablet Take 1 tablet by mouth every 6 (six) hours as needed. 05/18/19   [provider]  latanoprost (XALATAN) 0.005 % ophthalmic solution  1 drop at bedtime.    [provider]  methocarbamol (ROBAXIN) 500 MG tablet Take 500 mg by mouth every 6 (six) hours as needed. 06/30/19   [provider]  polyethylene glycol (MIRALAX / GLYCOLAX) packet Take 17 g by mouth daily as needed. hasnt used in a week     [provider]    Allergies    Patient has no known allergies.  Review of Systems   Review of Systems  Constitutional: Negative for chills and fever.  HENT: Negative for congestion and sore throat.   Respiratory: Negative for cough and shortness of breath.   Cardiovascular: Positive for chest pain.  Gastrointestinal: Negative for abdominal pain, diarrhea, nausea and vomiting.  Genitourinary: Negative for enuresis and  flank pain.  Musculoskeletal: Negative for back pain.  Skin: Negative for rash.  Neurological: Positive for headaches. Negative for dizziness.  Hematological: Does not bruise/bleed easily.    Physical Exam Updated Vital Signs BP (!) 162/104   Pulse 93   Temp 98.5 F (36.9 C) (Oral)   Resp 11   Ht 5\' 11"  (1.803 m)   Wt 95.3 kg   SpO2 90%   BMI 29.29 kg/m   Physical Exam Vitals and nursing note reviewed.  Constitutional:      General: He is not in acute distress.    Appearance: He is not ill-appearing.  HENT:     Head: Normocephalic and atraumatic.     Nose: No congestion.     Mouth/Throat:     Mouth: Mucous membranes are moist.     Pharynx: Oropharynx is clear. No oropharyngeal exudate or posterior oropharyngeal erythema.  Eyes:     Conjunctiva/sclera: Conjunctivae normal.  Cardiovascular:     Rate and Rhythm: Regular rhythm. Tachycardia present.     Pulses: Normal pulses.     Heart sounds: No murmur heard. No friction rub. No gallop.   Pulmonary:     Effort: No respiratory distress.     Breath sounds: No wheezing, rhonchi or rales.  Abdominal:     Palpations: Abdomen is soft.     Tenderness: There is abdominal tenderness.     Comments: Patient abdomen is nondistended, slight tenderness to palp palpation in his epigastric region, there is no Murphy sign, negative rebound tenderness, no peritoneal sign noted.  No CVA tenderness.  Musculoskeletal:     Right lower leg: No edema.     Left lower leg: No edema.  Skin:    General: Skin is warm and dry.  Neurological:     Mental Status: He is alert.  Psychiatric:        Mood and Affect: Mood normal.     ED Results / Procedures / Treatments   Labs (all labs ordered are listed, but only abnormal results are displayed) Labs Reviewed  RESP PANEL BY RT-PCR (RSV, FLU A&B, COVID)  [id number] - Abnormal; Notable for the following components:      Result Value   SARS Coronavirus 2 by RT PCR POSITIVE (*)    All other  components within normal limits  CBC - Abnormal; Notable for the following components:   RBC 5.87 (*)    HCT 53.5 (*)    All other components within normal limits  BASIC METABOLIC PANEL  TROPONIN I (HIGH SENSITIVITY)  TROPONIN I (HIGH SENSITIVITY)    EKG EKG Interpretation  Date/Time:  Tuesday November 26 2020 13:40:12 EST Ventricular Rate:  118 PR Interval:    QRS Duration: 74 QT Interval:  306 QTC Calculation: 428 R Axis:   46 Text Interpretation: Sinus tachycardia Cannot rule out Anterior infarct , age undetermined Abnormal ECG No old tracing to compare Confirmed by Deno Etienne [phone]) on 11/26/2020 4:41:01 PM   Radiology DG Chest Portable 1 View  Result Date: 11/26/2020 CLINICAL DATA:  Chest pain.  Recent COVID exposure. EXAM: PORTABLE CHEST 1 VIEW COMPARISON:  None. FINDINGS: The heart size and mediastinal contours are within normal limits. Both lungs are clear. The visualized skeletal structures are unremarkable. IMPRESSION: No active disease. Electronically Signed   By: Franchot Gallo M.D.   On: 11/26/2020 14:19    Procedures Procedures (including critical care time)  Medications Ordered in ED Medications  alum & mag hydroxide-simeth (MAALOX/MYLANTA) 200-200-20 MG/5ML suspension 30 mL (30 mLs Oral Given 11/26/20 1714)    And  lidocaine (XYLOCAINE) 2 % viscous mouth solution 15 mL (15 mLs Oral Given 11/26/20 1714)    ED Course  I have reviewed the triage vital signs and the nursing notes.  Pertinent labs & imaging results that were available during my care of the patient were reviewed by me and considered in my medical decision making (see chart for details).    MDM Rules/Calculators/A&P                          Patient complaints with acid reflux and left shoulder pain.  He is alert, does not appear acute distress, vital signs significant for tachycardia.  Will obtain chest pain work-up, provide patient with GI cocktail and reevaluate.  Patient is reassessed after  providing him with a GI cocktail, states his stomach feels better, has no other complaints at this time, vital signs remained stable.  CBC negative for leukocytosis or signs of anemia, BMP negative for electrolyte abnormalities, no metabolic acidosis, no AKI, no anion gap present.  Patient had negative delta troponin.  Respiratory panel positive for Covid.  Chest x-ray does not reveal any acute findings.  EKG with sinus tach without signs of ischemia no ST elevation or depression noted.  I have low suspicion for ACS as history is atypical, patient has no cardiac history, EKG was sinus rhythm without signs of ischemia, patient had a delta troponin.  Low suspicion for PE as patient denies pleuritic chest pain, shortness of breath, patient denies leg pain, no pedal edema noted on exam, patient does have noted tachycardia but I suspect this is secondary due to Covid infection. Low suspicion for systemic infection as patient is nontoxic-appearing, vital signs reassuring, no obvious source infection noted on exam.  Low suspicion for pneumonia as lung sounds are clear bilaterally, x-ray did not reveal any acute findings. low suspicion for strep throat as oropharynx was visualized, no erythema or exudates noted.  Low suspicion patient would need  hospitalized due  Covid as vital signs reassuring, patient is not in respiratory distress.  Suspect patient's nasal congestion and headache are secondary due to COVID-19, patient does not qualify for metabolic infusion, will recommend over-the-counter pain medications follow-up with post Covid care.  Suspect patient's substernal chest pain is secondary due to acid reflux will start him on a acid pill and follow-up with PCP for further evaluation.   Vital signs have remained stable, no indication for hospital admission.  Patient given at home care as well strict return precautions.  Patient verbalized that they understood agreed to said plan.  Final Clinical Impression(s) /  ED Diagnoses Final diagnoses:  COVID  Heartburn symptom  Rx / DC Orders ED Discharge Orders         Ordered    omeprazole (PRILOSEC) 20 MG capsule  Daily        11/26/20 1754           Marcello Fennel, PA-C 11/26/20 Watrous, [place], DO 11/26/20 Johnnye Lana

## 2020-12-30 ENCOUNTER — Encounter (HOSPITAL_BASED_OUTPATIENT_CLINIC_OR_DEPARTMENT_OTHER): Payer: Self-pay | Admitting: *Deleted

## 2020-12-30 ENCOUNTER — Emergency Department (HOSPITAL_BASED_OUTPATIENT_CLINIC_OR_DEPARTMENT_OTHER): Payer: BC Managed Care – PPO

## 2020-12-30 ENCOUNTER — Other Ambulatory Visit: Payer: Self-pay

## 2020-12-30 ENCOUNTER — Emergency Department (HOSPITAL_BASED_OUTPATIENT_CLINIC_OR_DEPARTMENT_OTHER)
Admission: EM | Admit: 2020-12-30 | Discharge: 2020-12-30 | Disposition: A | Discharge status: Home / Self Care | Payer: BC Managed Care – PPO | Attending: Emergency Medicine | Admitting: Emergency Medicine

## 2020-12-30 DIAGNOSIS — M25532 Pain in left wrist: Secondary | ICD-10-CM

## 2020-12-30 DIAGNOSIS — Z87891 Personal history of nicotine dependence: Secondary | ICD-10-CM | POA: Diagnosis not present

## 2020-12-30 NOTE — ED Notes (Signed)
ED Provider at bedside. 

## 2020-12-30 NOTE — ED Triage Notes (Signed)
Left wrist pain x 5 days.

## 2020-12-30 NOTE — ED Provider Notes (Signed)
[name] Provider Note   CSN: [id number] Arrival date & time: 12/30/20  1513     History Chief Complaint  Patient presents with  . Wrist Pain    Blake Jordan is a 40 y.o. male presenting to the emergency department for evaluation of left wrist pain.  He states about 1 week ago he was helping someone demolition in a house.  He was using a hammer.  He states his right wrist was starting to bother him so he started to use his left.  He noted his left wrist was sore that night, however felt better the next day.  States yesterday he was doing some shoveling and pain returned.  It is to the dorsal aspect of his wrist, worse with moving his thumb.  He has associated swelling.  He iced his wrist today and treated with Tylenol.  He is right-hand dominant.  The history is provided by the patient.       Past Medical History:  Diagnosis Date  . Fatty liver   . GERD (gastroesophageal reflux disease)   . Glaucoma    potentially will have - needs eye drops  . History of kidney stones   . Hypercholesterolemia     Patient Active Problem List   Diagnosis Date Noted  . Left arm pain 02/19/2020  . Fatty liver 06/06/2018  . Rosacea 03/24/2018  . Anxiety 08/17/2017    Past Surgical History:  Procedure Laterality Date  . CARDIOVASCULAR STRESS TEST    . DENTAL RESTORATION/EXTRACTION WITH X-RAY    . ESOPHAGOGASTRODUODENOSCOPY  2018       Family History  Problem Relation Age of Onset  . Pancreatic cancer Mother   . Heart disease Father   . COPD Father   . Hypertension Father   . Heart disease Brother   . Diabetes Maternal Grandmother   . Cirrhosis Maternal Aunt        alcohol related  . Colon cancer Neg Hx   . Esophageal cancer Neg Hx   . Stomach cancer Neg Hx     Social History   Tobacco Use  . Smoking status: Former Smoker    Quit date: 11/23/2013    Years since quitting: 7.1  . Smokeless tobacco: Former Systems developer    Quit date: 11/24/2015   Vaping Use  . Vaping Use: Never used  Substance Use Topics  . Alcohol use: No  . Drug use: No    Home Medications Prior to Admission medications   Medication Sig Start Date End Date Taking? Authorizing Provider  baclofen (LIORESAL) 10 MG tablet Take 10 mg by mouth 2 (two) times daily as needed. 05/18/19   [provider]  cyclobenzaprine (FLEXERIL) 5 MG tablet Take 5-10 mg by mouth at bedtime as needed. 06/30/19   [provider]  DULoxetine (CYMBALTA) 30 MG capsule Take 1 capsule (30 mg total) by mouth daily. 03/19/20   Dettinger, Fransisca Kaufmann, MD  HYDROcodone-acetaminophen (NORCO/VICODIN) 5-325 MG tablet Take 1 tablet by mouth every 6 (six) hours as needed. 05/18/19   [provider]  latanoprost (XALATAN) 0.005 % ophthalmic solution 1 drop at bedtime.    [provider]  methocarbamol (ROBAXIN) 500 MG tablet Take 500 mg by mouth every 6 (six) hours as needed. 06/30/19   [provider]  omeprazole (PRILOSEC) 20 MG capsule Take 1 capsule (20 mg total) by mouth daily. 11/26/20 12/26/20  Marcello Fennel, PA-C  polyethylene glycol [facility]) packet Take 17 g  by mouth daily as needed. hasnt used in a week     [provider]    Allergies    Patient has no known allergies.  Review of Systems   Review of Systems  Musculoskeletal: Positive for arthralgias and myalgias.  Skin: Negative for wound.  Neurological: Negative for numbness.    Physical Exam Updated Vital Signs BP (!) 142/92   Pulse 72   Temp 98.1 F (36.7 C) (Oral)   Resp 18   Ht 5\' 11"  (1.803 m)   Wt 95.3 kg   SpO2 98%   BMI 29.30 kg/m   Physical Exam Vitals and nursing note reviewed.  Constitutional:      General: He is not in acute distress.    Appearance: He is well-developed.  HENT:     Head: Normocephalic and atraumatic.  Eyes:     Conjunctiva/sclera: Conjunctivae normal.  Cardiovascular:     Rate and Rhythm: Normal rate.     Comments: L Radial  pulse intact with brisk cap refill Pulmonary:     Effort: Pulmonary effort is normal.  Musculoskeletal:     Comments: Left wrist and hand without deformity. Mild swelling noted to dorsal aspect of distal forearm. Wrist joint without swelling. No bruising. No pain in the wrist or hand with ROM. Does have some pain with abduction of the thumb within the region of the abductor pollicis longus muscle.  Neg finklestein   Neurological:     Mental Status: He is alert.     Comments: Normal sensation to the digits on left hand  Psychiatric:        Mood and Affect: Mood normal.        Behavior: Behavior normal.     ED Results / Procedures / Treatments   Labs (all labs ordered are listed, but only abnormal results are displayed) Labs Reviewed - No data to display  EKG None  Radiology DG Wrist Complete Left  Result Date: 12/30/2020 CLINICAL DATA:  Left wrist pain 5 days ago after dome relation projects EXAM: LEFT WRIST - COMPLETE 3+ VIEW COMPARISON:  None. FINDINGS: No acute bony abnormality. Specifically, no fracture, subluxation, or dislocation. Appearance at the capitohamate articulation may suggest a degree of coalition. Soft tissues are. IMPRESSION: 1. No acute osseous abnormality. 2. Suggested coalition of the capitohamate articulation. Electronically Signed   By: Lovena Le M.D.   On: 12/30/2020 16:19    Procedures .Splint Application  Date/Time: 12/30/2020 4:39 PM Performed by: Jeffie Widdowson, [place] N, PA-C Authorized by: Race Latour, [place] N, PA-C   Consent:    Consent obtained:  Verbal   Consent given by:  Patient   Alternatives discussed:  No treatment Pre-procedure details:    Distal neurologic exam:  Normal   Distal perfusion: distal pulses strong   Procedure details:    Location:  Wrist   Wrist location:  L wrist   Cast type:  Short arm   Splint type:  Wrist   Supplies:  Elastic bandage   Attestation: Splint applied and adjusted personally by me   Post-procedure details:     Distal neurologic exam:  Normal   Distal perfusion: distal pulses strong     Procedure completion:  Tolerated well, no immediate complications     Medications Ordered in ED Medications - No data to display  ED Course  I have reviewed the triage vital signs and the nursing notes.  Pertinent labs & imaging results that were available during my care of the patient were  reviewed by me and considered in my medical decision making (see chart for details).  Clinical Course as of 12/30/20 1639  Mon Dec 30, 2020  1625 I was directly involved in this patients medical care.  [JH]    Clinical Course User Index [JH] [place], Greggory Brandy, MD   MDM Rules/Calculators/A&P                          Patient presenting with left wrist pain, likely strain.  X-rays negative for acute fracture.  He has full normal range of motion.  Mild swelling about the region of the abductor pollicis longus and associated pain with abduction of the thumb.  Negative Finkelstein sign.  He is treated with compression with Ace wrap.  Recommend NSAIDs, RICE therapy.  He is from the sports medicine referral for follow-up as needed.  Patient is discharged in no acute distress.  Discussed results, findings, treatment and follow up. Patient advised of return precautions. Patient verbalized understanding and agreed with plan.  Final Clinical Impression(s) / ED Diagnoses Final diagnoses:  Left wrist pain    Rx / DC Orders ED Discharge Orders    None       Webster Patrone, [place] N, PA-C 12/30/20 1639    Luna Fuse, MD 01/03/21 2130

## 2020-12-30 NOTE — Discharge Instructions (Signed)
Please read instructions below. Apply ice to your wrist for 20 minutes at a time. You can take your prescribed pain medication as directed for pain. Schedule an appointment with the sports medicine  specialist for follow-up on your injury if symptoms persist. Return to the ER for new or concerning symptoms.

## 2023-01-22 ENCOUNTER — Other Ambulatory Visit (HOSPITAL_COMMUNITY): Payer: Self-pay | Admitting: Cardiology

## 2023-01-22 DIAGNOSIS — R0789 Other chest pain: Secondary | ICD-10-CM

## 2023-01-29 ENCOUNTER — Ambulatory Visit (HOSPITAL_BASED_OUTPATIENT_CLINIC_OR_DEPARTMENT_OTHER)
Admission: RE | Admit: 2023-01-29 | Discharge: 2023-01-29 | Disposition: A | Discharge status: Home / Self Care | Payer: BC Managed Care – PPO | Source: Ambulatory Visit | Attending: Cardiology | Admitting: Cardiology

## 2023-01-29 DIAGNOSIS — R0789 Other chest pain: Secondary | ICD-10-CM | POA: Insufficient documentation
